# Patient Record
Sex: Female | Born: 1955 | Race: White | Hispanic: No | Marital: Married | Smoking: Former smoker
Health system: Southern US, Community
[De-identification: ages and names within clinical notes are randomized; demographics above are authoritative.]

## PROBLEM LIST (undated history)

## (undated) DIAGNOSIS — G709 Myoneural disorder, unspecified: Secondary | ICD-10-CM

## (undated) DIAGNOSIS — E785 Hyperlipidemia, unspecified: Secondary | ICD-10-CM

## (undated) DIAGNOSIS — F319 Bipolar disorder, unspecified: Secondary | ICD-10-CM

## (undated) DIAGNOSIS — F32A Depression, unspecified: Secondary | ICD-10-CM

## (undated) DIAGNOSIS — F419 Anxiety disorder, unspecified: Secondary | ICD-10-CM

## (undated) DIAGNOSIS — J449 Chronic obstructive pulmonary disease, unspecified: Secondary | ICD-10-CM

## (undated) DIAGNOSIS — J189 Pneumonia, unspecified organism: Secondary | ICD-10-CM

## (undated) DIAGNOSIS — F329 Major depressive disorder, single episode, unspecified: Secondary | ICD-10-CM

## (undated) HISTORY — PX: BREAST BIOPSY: SHX20

## (undated) HISTORY — PX: ABDOMINAL HYSTERECTOMY: SHX81

## (undated) HISTORY — PX: KNEE ARTHROSCOPY: SUR90

## (undated) HISTORY — PX: BACK SURGERY: SHX140

## (undated) HISTORY — PX: CARPAL TUNNEL RELEASE: SHX101

## (undated) HISTORY — PX: TUBAL LIGATION: SHX77

---

## 1999-03-22 ENCOUNTER — Ambulatory Visit (HOSPITAL_COMMUNITY): Admission: RE | Admit: 1999-03-22 | Discharge: 1999-03-22 | Payer: Self-pay | Admitting: Neurosurgery

## 1999-03-22 ENCOUNTER — Encounter: Payer: Self-pay | Admitting: Neurosurgery

## 1999-04-06 ENCOUNTER — Encounter: Payer: Self-pay | Admitting: Neurosurgery

## 1999-04-06 ENCOUNTER — Ambulatory Visit (HOSPITAL_COMMUNITY): Admission: RE | Admit: 1999-04-06 | Discharge: 1999-04-06 | Payer: Self-pay | Admitting: Neurosurgery

## 1999-04-20 ENCOUNTER — Ambulatory Visit (HOSPITAL_COMMUNITY): Admission: RE | Admit: 1999-04-20 | Discharge: 1999-04-20 | Payer: Self-pay | Admitting: Neurosurgery

## 1999-04-20 ENCOUNTER — Encounter: Payer: Self-pay | Admitting: Neurosurgery

## 1999-06-21 ENCOUNTER — Encounter: Payer: Self-pay | Admitting: Neurosurgery

## 1999-06-26 ENCOUNTER — Inpatient Hospital Stay (HOSPITAL_COMMUNITY): Admission: RE | Admit: 1999-06-26 | Discharge: 1999-06-26 | Payer: Self-pay | Admitting: Neurosurgery

## 1999-06-26 ENCOUNTER — Encounter: Payer: Self-pay | Admitting: Neurosurgery

## 1999-07-25 ENCOUNTER — Ambulatory Visit (HOSPITAL_COMMUNITY): Admission: RE | Admit: 1999-07-25 | Discharge: 1999-07-25 | Payer: Self-pay | Admitting: Neurosurgery

## 1999-07-25 ENCOUNTER — Encounter: Payer: Self-pay | Admitting: Neurosurgery

## 1999-10-02 ENCOUNTER — Encounter: Payer: Self-pay | Admitting: Neurosurgery

## 1999-10-02 ENCOUNTER — Ambulatory Visit (HOSPITAL_COMMUNITY): Admission: RE | Admit: 1999-10-02 | Discharge: 1999-10-02 | Payer: Self-pay | Admitting: Neurosurgery

## 2001-04-20 ENCOUNTER — Ambulatory Visit (HOSPITAL_COMMUNITY): Admission: RE | Admit: 2001-04-20 | Discharge: 2001-04-20 | Payer: Self-pay | Admitting: Neurosurgery

## 2001-04-20 ENCOUNTER — Encounter: Payer: Self-pay | Admitting: Neurosurgery

## 2001-04-23 ENCOUNTER — Ambulatory Visit (HOSPITAL_COMMUNITY): Admission: RE | Admit: 2001-04-23 | Discharge: 2001-04-23 | Payer: Self-pay | Admitting: Neurosurgery

## 2001-04-23 ENCOUNTER — Encounter: Payer: Self-pay | Admitting: Neurosurgery

## 2003-08-02 ENCOUNTER — Ambulatory Visit (HOSPITAL_COMMUNITY): Admission: RE | Admit: 2003-08-02 | Discharge: 2003-08-03 | Payer: Self-pay | Admitting: Neurosurgery

## 2003-08-31 ENCOUNTER — Encounter: Admission: RE | Admit: 2003-08-31 | Discharge: 2003-08-31 | Payer: Self-pay | Admitting: Neurosurgery

## 2003-11-03 ENCOUNTER — Encounter: Admission: RE | Admit: 2003-11-03 | Discharge: 2003-11-03 | Payer: Self-pay | Admitting: Neurosurgery

## 2004-04-04 ENCOUNTER — Encounter: Admission: RE | Admit: 2004-04-04 | Discharge: 2004-04-04 | Payer: Self-pay | Admitting: Neurosurgery

## 2006-02-15 IMAGING — CR DG LUMBAR SPINE 2-3V
3 series · 3 of 3 positions shown · non-contrast
Comparison: none

CLINICAL DATA: Bilateral hip pain, status post lumbar fusion on 07/23/03.
 TWO VIEW LUMBAR SPINE ? 11/03/03 
 Comparison 08/31/03.  AP and two lateral views again demonstrate a transitional lumbosacral vertebra labeled L5 for counting purposes.  This has bilateral pseudoarticulations with the sacrum with secondary degenerative changes on the left.  Stable ray cage and pedicle screw and rod fixation at the L4-5 level with normal alignment.  Stable moderate disk space narrowing and minimal anterior and posterior spur formation at the L5-S1 level.  
 IMPRESSION
 1.  Stable hardware fusion at the L4-5 level with normal alignment.
 2.  Transitional L5 vertebra with bilateral pseudoarticulations with the sacrum with secondary degenerative changes on the left.

[view not recorded (1 of 3)]
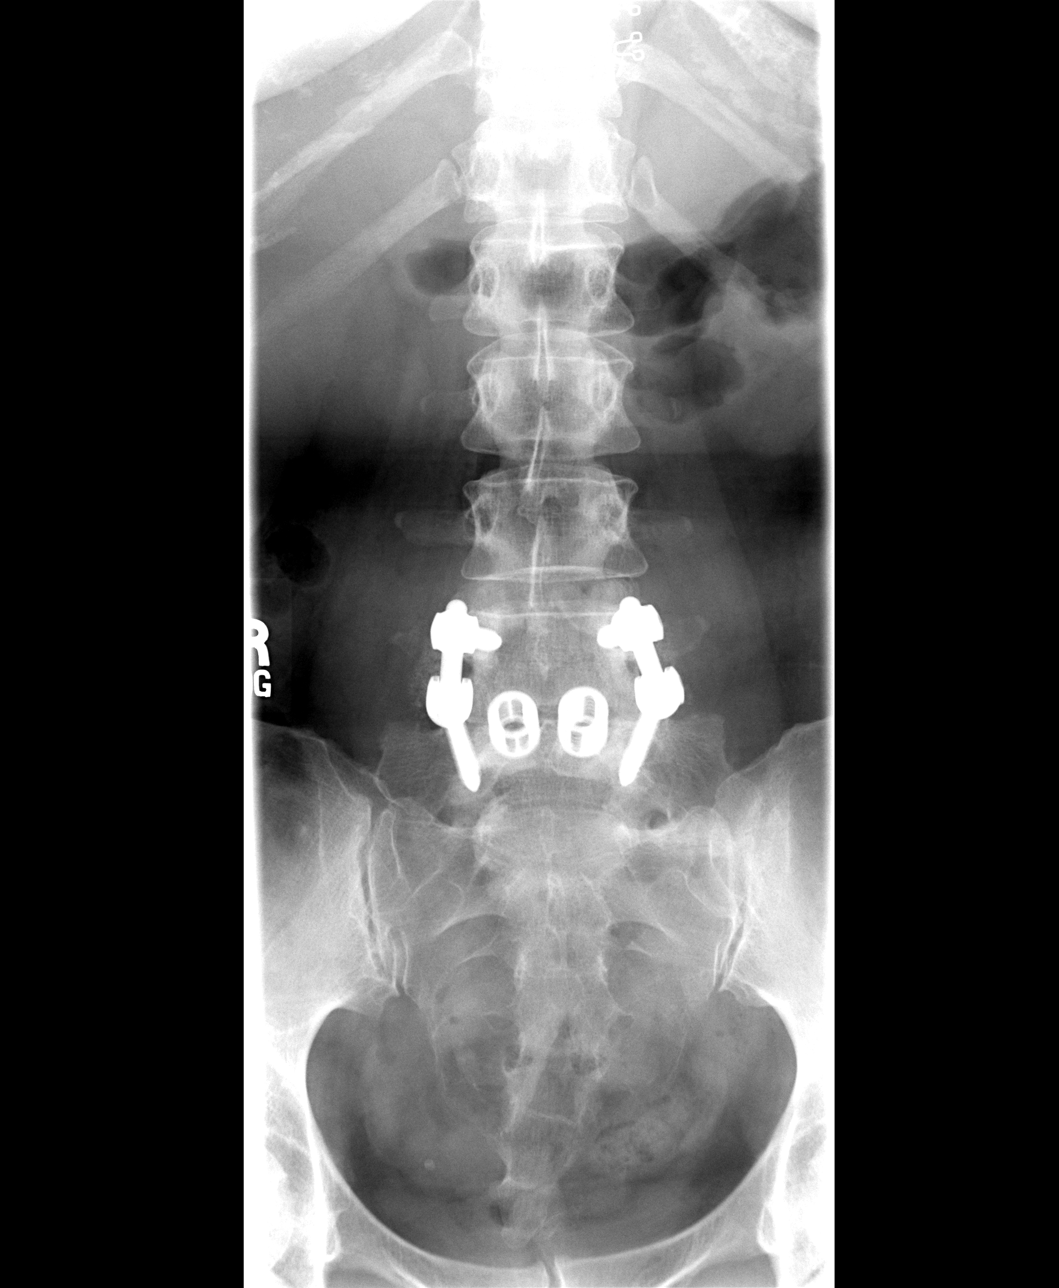

[view not recorded (2 of 3)]
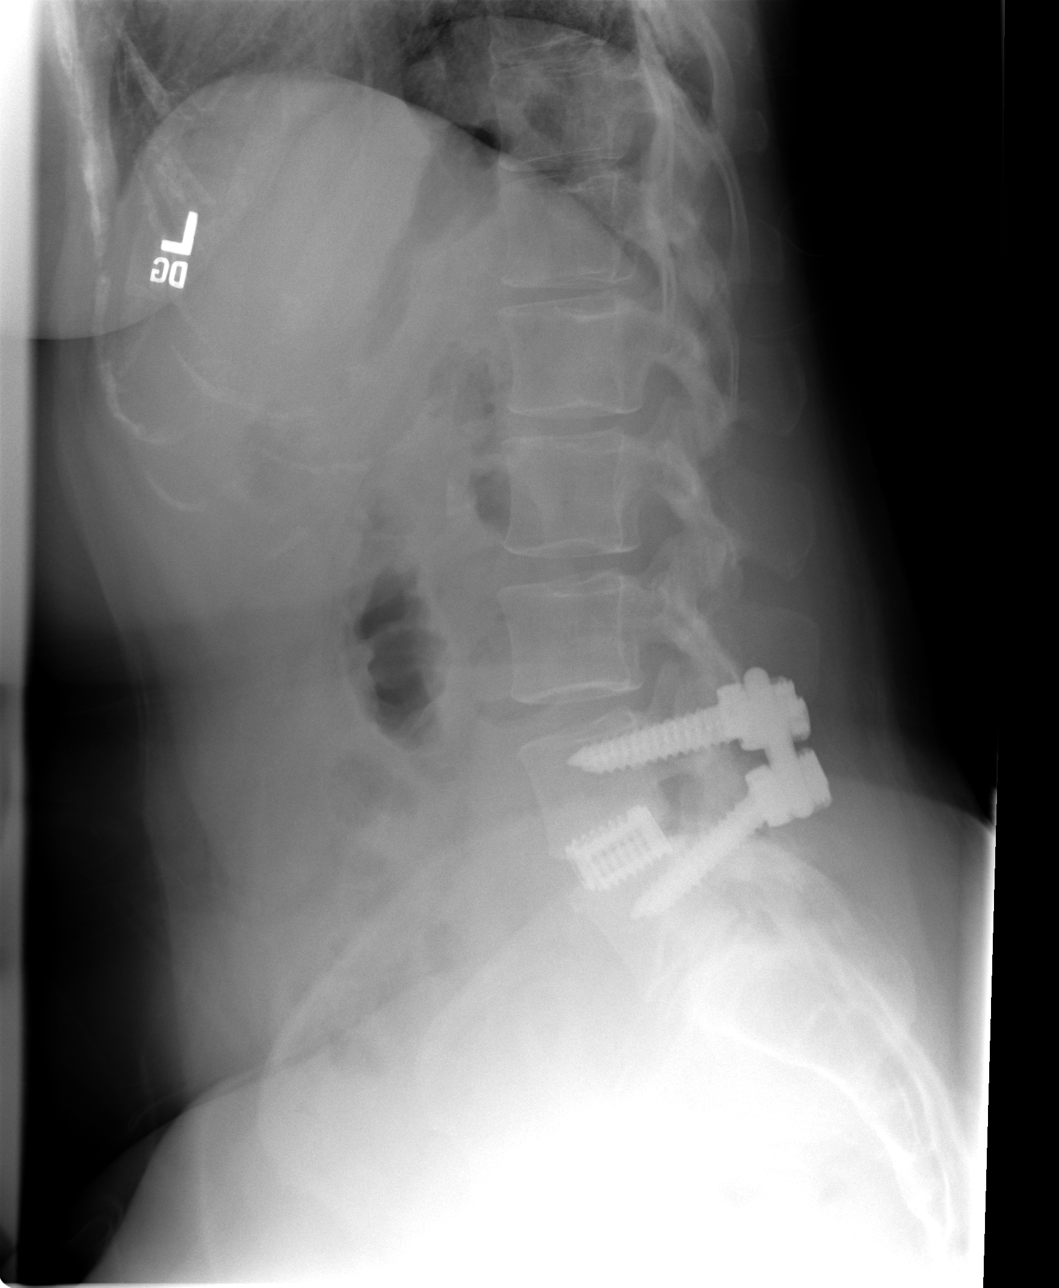

[view not recorded (3 of 3)]
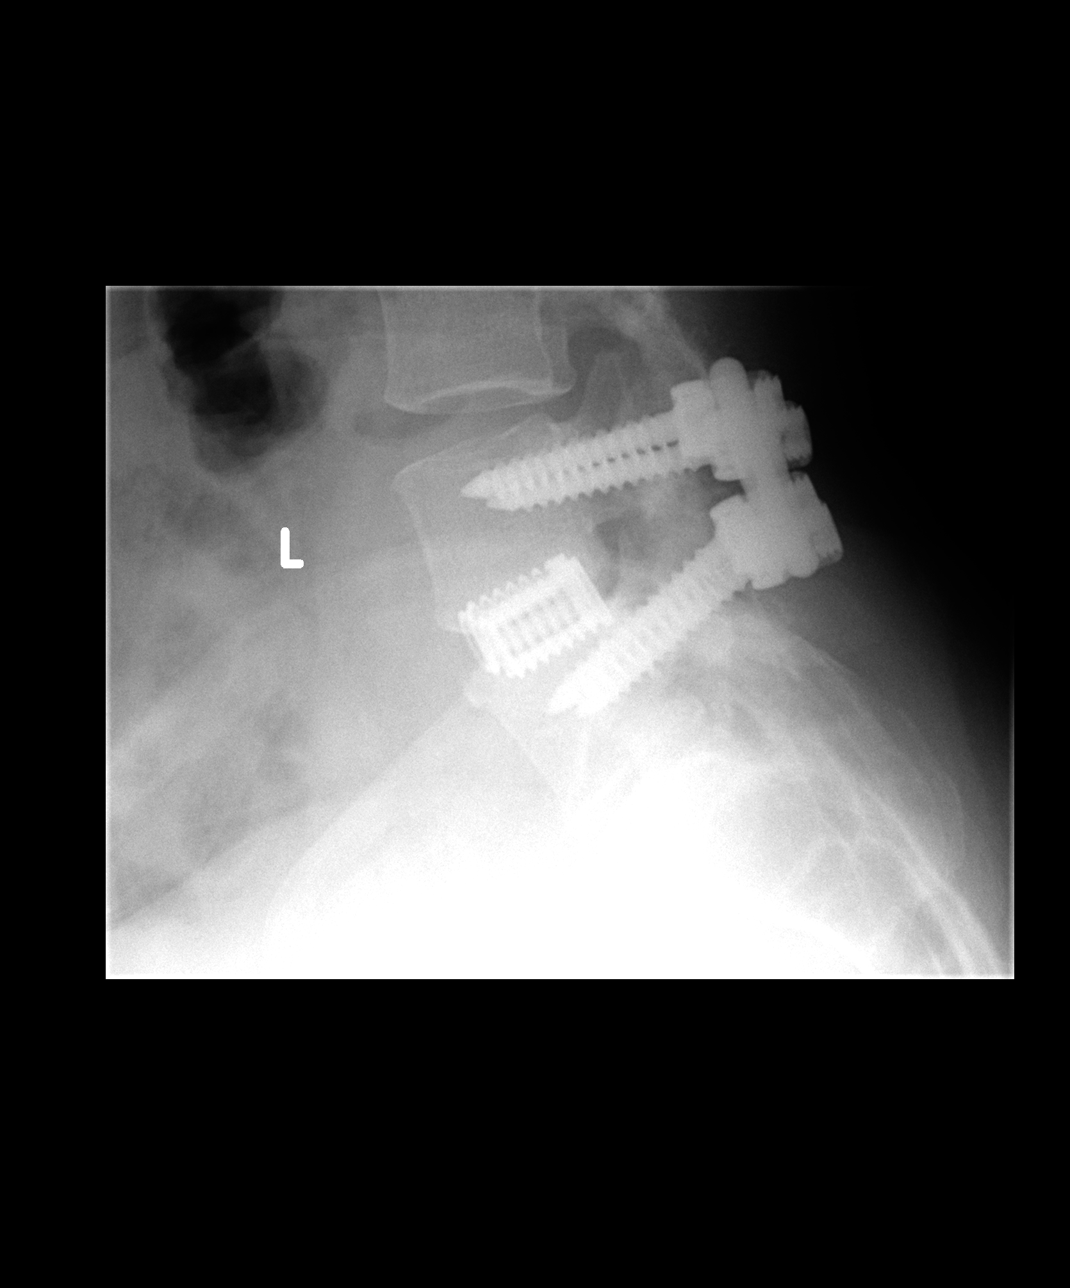

[3 of 3 positions shown; findings below may reference images not displayed]

## 2012-07-07 ENCOUNTER — Other Ambulatory Visit: Payer: Self-pay | Admitting: Neurosurgery

## 2012-07-07 ENCOUNTER — Encounter (HOSPITAL_COMMUNITY): Payer: Self-pay | Admitting: Pharmacy Technician

## 2012-07-10 ENCOUNTER — Encounter (HOSPITAL_COMMUNITY)
Admission: RE | Admit: 2012-07-10 | Discharge: 2012-07-10 | Disposition: A | Discharge status: Home / Self Care | Payer: Worker's Compensation | Source: Ambulatory Visit | Attending: Neurosurgery | Admitting: Neurosurgery

## 2012-07-10 ENCOUNTER — Encounter (HOSPITAL_COMMUNITY): Payer: Self-pay

## 2012-07-10 HISTORY — DX: Chronic obstructive pulmonary disease, unspecified: J44.9

## 2012-07-10 HISTORY — DX: Depression, unspecified: F32.A

## 2012-07-10 HISTORY — DX: Major depressive disorder, single episode, unspecified: F32.9

## 2012-07-10 HISTORY — DX: Pneumonia, unspecified organism: J18.9

## 2012-07-10 HISTORY — DX: Hyperlipidemia, unspecified: E78.5

## 2012-07-10 HISTORY — DX: Myoneural disorder, unspecified: G70.9

## 2012-07-10 HISTORY — DX: Anxiety disorder, unspecified: F41.9

## 2012-07-10 HISTORY — DX: Bipolar disorder, unspecified: F31.9

## 2012-07-10 LAB — CBC WITH DIFFERENTIAL/PLATELET
Basophils Absolute: 0 10*3/uL (ref 0.0–0.1)
Basophils Relative: 0 % (ref 0–1)
Eosinophils Absolute: 0.2 10*3/uL (ref 0.0–0.7)
HCT: 39 % (ref 36.0–46.0)
MCH: 31 pg (ref 26.0–34.0)
MCHC: 33.3 g/dL (ref 30.0–36.0)
Monocytes Absolute: 0.6 10*3/uL (ref 0.1–1.0)
Neutro Abs: 4.5 10*3/uL (ref 1.7–7.7)
Neutrophils Relative %: 53 % (ref 43–77)
RDW: 12.8 % (ref 11.5–15.5)

## 2012-07-10 LAB — BASIC METABOLIC PANEL
BUN: 8 mg/dL (ref 6–23)
CO2: 28 mEq/L (ref 19–32)
Calcium: 9.8 mg/dL (ref 8.4–10.5)
Chloride: 102 mEq/L (ref 96–112)
Creatinine, Ser: 0.87 mg/dL (ref 0.50–1.10)
GFR calc Af Amer: 85 mL/min — ABNORMAL LOW (ref >90)
GFR calc non Af Amer: 74 mL/min — ABNORMAL LOW (ref >90)
Glucose, Bld: 78 mg/dL (ref 70–99)
Potassium: 3.6 mEq/L (ref 3.5–5.1)
Sodium: 140 mEq/L (ref 135–145)

## 2012-07-10 LAB — SURGICAL PCR SCREEN
MRSA, PCR: NEGATIVE
Staphylococcus aureus: NEGATIVE

## 2012-07-10 NOTE — Pre-Procedure Instructions (Signed)
20 Patricia Miles  07/10/2012   Your procedure is scheduled on:  July 14, 2012 (Tuesday)  Report to [facility] at 8:00 AM.  Call this number if you have problems the morning of surgery: [phone]   Remember:   Do not eat food:After Midnight.    Take these medicines the morning of surgery with A SIP OF WATER: atenolol (tenormin), citalopram (celexa), clorazepate (tranxene),  Venlafaxine (effexor), lamotrigine (lamictal), buspirone (buspar)   Do not wear jewelry, make-up or nail polish.  Do not wear lotions, powders, or perfumes. You may wear deodorant.  Do not shave 48 hours prior to surgery. Men may shave face and neck.  Do not bring valuables to the hospital.  Contacts, dentures or bridgework may not be worn into surgery.  Leave suitcase in the car. After surgery it may be brought to your room.  For patients admitted to the hospital, checkout time is 11:00 AM the day of discharge.   Patients discharged the day of surgery will not be allowed to drive home.  Name and phone number of your driver:   Special Instructions: Shower using CHG 2 nights before surgery and the night before surgery.  If you shower the day of surgery use CHG.  Use special wash - you have one bottle of CHG for all showers.  You should use approximately 1/3 of the bottle for each shower.   Please read over the following fact sheets that you were given: Pain Booklet, Coughing and Deep Breathing, Blood Transfusion Information and Surgical Site Infection Prevention

## 2012-07-11 LAB — TYPE AND SCREEN

## 2012-07-13 MED ORDER — DEXAMETHASONE SODIUM PHOSPHATE 10 MG/ML IJ SOLN
10.0000 mg | INTRAMUSCULAR | Status: AC
  Administered 2012-07-14: 10 mg via INTRAVENOUS
  Filled 2012-07-13: qty 1

## 2012-07-13 MED ORDER — CEFAZOLIN SODIUM-DEXTROSE 2-3 GM-% IV SOLR
2.0000 g | INTRAVENOUS | Status: AC
  Administered 2012-07-14: 2 g via INTRAVENOUS
  Filled 2012-07-13: qty 50

## 2012-07-14 ENCOUNTER — Ambulatory Visit (HOSPITAL_COMMUNITY): Payer: Worker's Compensation

## 2012-07-14 ENCOUNTER — Encounter (HOSPITAL_COMMUNITY): Payer: Self-pay | Admitting: Anesthesiology

## 2012-07-14 ENCOUNTER — Encounter (HOSPITAL_COMMUNITY): Payer: Self-pay | Admitting: *Deleted

## 2012-07-14 ENCOUNTER — Inpatient Hospital Stay (HOSPITAL_COMMUNITY)
Admission: RE | Admit: 2012-07-14 | Discharge: 2012-07-15 | DRG: 460 | Disposition: A | Discharge status: Home / Self Care | Payer: Worker's Compensation | Source: Ambulatory Visit | Attending: Neurosurgery | Admitting: Neurosurgery

## 2012-07-14 ENCOUNTER — Encounter (HOSPITAL_COMMUNITY): Payer: Self-pay | Admitting: Neurosurgery

## 2012-07-14 ENCOUNTER — Ambulatory Visit (HOSPITAL_COMMUNITY): Payer: Worker's Compensation | Admitting: Anesthesiology

## 2012-07-14 ENCOUNTER — Encounter (HOSPITAL_COMMUNITY)
Admission: RE | Disposition: A | Discharge status: Home / Self Care | Payer: Self-pay | Source: Ambulatory Visit | Attending: Neurosurgery

## 2012-07-14 DIAGNOSIS — Z87891 Personal history of nicotine dependence: Secondary | ICD-10-CM | POA: Diagnosis not present

## 2012-07-14 DIAGNOSIS — E785 Hyperlipidemia, unspecified: Secondary | ICD-10-CM | POA: Diagnosis present

## 2012-07-14 DIAGNOSIS — F411 Generalized anxiety disorder: Secondary | ICD-10-CM | POA: Diagnosis present

## 2012-07-14 DIAGNOSIS — J449 Chronic obstructive pulmonary disease, unspecified: Secondary | ICD-10-CM | POA: Diagnosis present

## 2012-07-14 DIAGNOSIS — F319 Bipolar disorder, unspecified: Secondary | ICD-10-CM | POA: Diagnosis present

## 2012-07-14 DIAGNOSIS — M48062 Spinal stenosis, lumbar region with neurogenic claudication: Secondary | ICD-10-CM | POA: Diagnosis present

## 2012-07-14 DIAGNOSIS — Z79899 Other long term (current) drug therapy: Secondary | ICD-10-CM

## 2012-07-14 DIAGNOSIS — Z981 Arthrodesis status: Secondary | ICD-10-CM

## 2012-07-14 DIAGNOSIS — J4489 Other specified chronic obstructive pulmonary disease: Secondary | ICD-10-CM | POA: Diagnosis present

## 2012-07-14 SURGERY — POSTERIOR LUMBAR FUSION 1 WITH HARDWARE REMOVAL
Anesthesia: General | Site: Back | Wound class: Clean

## 2012-07-14 MED ORDER — LAMOTRIGINE 200 MG PO TABS
200.0000 mg | ORAL_TABLET | Freq: Every day | ORAL | Status: DC
  Administered 2012-07-15: 200 mg via ORAL
  Filled 2012-07-14 (×2): qty 1

## 2012-07-14 MED ORDER — LIDOCAINE HCL (CARDIAC) 20 MG/ML IV SOLN
INTRAVENOUS | Status: DC | PRN
  Administered 2012-07-14: 100 mg via INTRAVENOUS

## 2012-07-14 MED ORDER — ONDANSETRON HCL 4 MG/2ML IJ SOLN
INTRAMUSCULAR | Status: DC | PRN
  Administered 2012-07-14: 4 mg via INTRAVENOUS

## 2012-07-14 MED ORDER — LACTATED RINGERS IV SOLN
INTRAVENOUS | Status: DC | PRN
  Administered 2012-07-14 (×2): via INTRAVENOUS

## 2012-07-14 MED ORDER — SIMVASTATIN 40 MG PO TABS
40.0000 mg | ORAL_TABLET | Freq: Every evening | ORAL | Status: DC
  Administered 2012-07-14: 40 mg via ORAL
  Filled 2012-07-14 (×2): qty 1

## 2012-07-14 MED ORDER — FENTANYL CITRATE 0.05 MG/ML IJ SOLN
INTRAMUSCULAR | Status: DC | PRN
  Administered 2012-07-14 (×2): 50 ug via INTRAVENOUS
  Administered 2012-07-14 (×2): 100 ug via INTRAVENOUS

## 2012-07-14 MED ORDER — FLEET ENEMA 7-19 GM/118ML RE ENEM
1.0000 | ENEMA | Freq: Once | RECTAL | Status: AC | PRN
  Filled 2012-07-14: qty 1

## 2012-07-14 MED ORDER — MIDAZOLAM HCL 5 MG/5ML IJ SOLN
INTRAMUSCULAR | Status: DC | PRN
  Administered 2012-07-14: 2 mg via INTRAVENOUS

## 2012-07-14 MED ORDER — BUSPIRONE HCL 15 MG PO TABS
15.0000 mg | ORAL_TABLET | Freq: Three times a day (TID) | ORAL | Status: DC
  Administered 2012-07-14 – 2012-07-15 (×3): 15 mg via ORAL
  Filled 2012-07-14 (×6): qty 1

## 2012-07-14 MED ORDER — OXYCODONE HCL 5 MG PO TABS: 5.0000 mg | ORAL_TABLET | Freq: Once | ORAL | Status: DC | PRN

## 2012-07-14 MED ORDER — MENTHOL 3 MG MT LOZG: 1.0000 | LOZENGE | OROMUCOSAL | Status: DC | PRN

## 2012-07-14 MED ORDER — EPHEDRINE SULFATE 50 MG/ML IJ SOLN
INTRAMUSCULAR | Status: DC | PRN
  Administered 2012-07-14: 10 mg via INTRAVENOUS
  Administered 2012-07-14 (×2): 5 mg via INTRAVENOUS

## 2012-07-14 MED ORDER — SODIUM CHLORIDE 0.9 % IJ SOLN
3.0000 mL | Freq: Two times a day (BID) | INTRAMUSCULAR | Status: DC
  Administered 2012-07-14: 3 mL via INTRAVENOUS

## 2012-07-14 MED ORDER — ARTIFICIAL TEARS OP OINT
TOPICAL_OINTMENT | OPHTHALMIC | Status: DC | PRN
  Administered 2012-07-14: 1 via OPHTHALMIC

## 2012-07-14 MED ORDER — ROCURONIUM BROMIDE 100 MG/10ML IV SOLN
INTRAVENOUS | Status: DC | PRN
  Administered 2012-07-14: 10 mg via INTRAVENOUS
  Administered 2012-07-14: 50 mg via INTRAVENOUS
  Administered 2012-07-14: 10 mg via INTRAVENOUS
  Administered 2012-07-14: 20 mg via INTRAVENOUS

## 2012-07-14 MED ORDER — PHENOL 1.4 % MT LIQD: 1.0000 | OROMUCOSAL | Status: DC | PRN

## 2012-07-14 MED ORDER — HYDROMORPHONE HCL PF 1 MG/ML IJ SOLN
0.2500 mg | INTRAMUSCULAR | Status: DC | PRN
  Administered 2012-07-14 (×4): 0.5 mg via INTRAVENOUS

## 2012-07-14 MED ORDER — BACITRACIN 50000 UNITS IM SOLR
INTRAMUSCULAR | Status: AC
  Filled 2012-07-14: qty 1

## 2012-07-14 MED ORDER — NEOSTIGMINE METHYLSULFATE 1 MG/ML IJ SOLN
INTRAMUSCULAR | Status: DC | PRN
  Administered 2012-07-14: 4 mg via INTRAVENOUS

## 2012-07-14 MED ORDER — SENNA 8.6 MG PO TABS
1.0000 | ORAL_TABLET | Freq: Two times a day (BID) | ORAL | Status: DC
  Administered 2012-07-14 – 2012-07-15 (×2): 8.6 mg via ORAL
  Filled 2012-07-14 (×3): qty 1

## 2012-07-14 MED ORDER — ADULT MULTIVITAMIN W/MINERALS CH
1.0000 | ORAL_TABLET | Freq: Every day | ORAL | Status: DC
  Administered 2012-07-14 – 2012-07-15 (×2): 1 via ORAL
  Filled 2012-07-14 (×2): qty 1

## 2012-07-14 MED ORDER — CLORAZEPATE DIPOTASSIUM 7.5 MG PO TABS
7.5000 mg | ORAL_TABLET | Freq: Two times a day (BID) | ORAL | Status: DC
  Administered 2012-07-14 – 2012-07-15 (×2): 7.5 mg via ORAL
  Filled 2012-07-14 (×2): qty 2

## 2012-07-14 MED ORDER — 0.9 % SODIUM CHLORIDE (POUR BTL) OPTIME
TOPICAL | Status: DC | PRN
  Administered 2012-07-14: 1000 mL

## 2012-07-14 MED ORDER — GABAPENTIN 100 MG PO CAPS
200.0000 mg | ORAL_CAPSULE | Freq: Two times a day (BID) | ORAL | Status: DC
  Administered 2012-07-14 – 2012-07-15 (×2): 200 mg via ORAL
  Filled 2012-07-14 (×3): qty 2

## 2012-07-14 MED ORDER — ZOLPIDEM TARTRATE 5 MG PO TABS: 5.0000 mg | ORAL_TABLET | Freq: Every evening | ORAL | Status: DC | PRN

## 2012-07-14 MED ORDER — PHENYLEPHRINE HCL 10 MG/ML IJ SOLN
INTRAMUSCULAR | Status: DC | PRN
  Administered 2012-07-14 (×3): 40 ug via INTRAVENOUS
  Administered 2012-07-14 (×5): 80 ug via INTRAVENOUS
  Administered 2012-07-14: 40 ug via INTRAVENOUS

## 2012-07-14 MED ORDER — MEPERIDINE HCL 25 MG/ML IJ SOLN: 6.2500 mg | INTRAMUSCULAR | Status: DC | PRN

## 2012-07-14 MED ORDER — BUPIVACAINE HCL (PF) 0.25 % IJ SOLN
INTRAMUSCULAR | Status: DC | PRN
  Administered 2012-07-14: 20 mL

## 2012-07-14 MED ORDER — ACETAMINOPHEN 650 MG RE SUPP: 650.0000 mg | RECTAL | Status: DC | PRN

## 2012-07-14 MED ORDER — SODIUM CHLORIDE 0.9 % IJ SOLN: 3.0000 mL | INTRAMUSCULAR | Status: DC | PRN

## 2012-07-14 MED ORDER — HYDROMORPHONE HCL PF 1 MG/ML IJ SOLN
INTRAMUSCULAR | Status: AC
  Filled 2012-07-14: qty 1

## 2012-07-14 MED ORDER — QUETIAPINE FUMARATE 300 MG PO TABS
300.0000 mg | ORAL_TABLET | Freq: Every day | ORAL | Status: DC
  Administered 2012-07-14: 300 mg via ORAL
  Filled 2012-07-14 (×2): qty 1

## 2012-07-14 MED ORDER — HYDROCODONE-ACETAMINOPHEN 5-325 MG PO TABS: 1.0000 | ORAL_TABLET | ORAL | Status: DC | PRN

## 2012-07-14 MED ORDER — GABAPENTIN 300 MG PO CAPS
300.0000 mg | ORAL_CAPSULE | Freq: Every day | ORAL | Status: DC
  Administered 2012-07-14: 300 mg via ORAL
  Filled 2012-07-14 (×2): qty 1

## 2012-07-14 MED ORDER — GLYCOPYRROLATE 0.2 MG/ML IJ SOLN
INTRAMUSCULAR | Status: DC | PRN
  Administered 2012-07-14: 0.2 mg via INTRAVENOUS
  Administered 2012-07-14: .8 mg via INTRAVENOUS

## 2012-07-14 MED ORDER — HYDROMORPHONE HCL PF 1 MG/ML IJ SOLN: 0.5000 mg | INTRAMUSCULAR | Status: DC | PRN

## 2012-07-14 MED ORDER — SODIUM CHLORIDE 0.9 % IV SOLN
INTRAVENOUS | Status: AC
  Filled 2012-07-14: qty 500

## 2012-07-14 MED ORDER — OXYCODONE-ACETAMINOPHEN 5-325 MG PO TABS
1.0000 | ORAL_TABLET | ORAL | Status: DC | PRN
  Administered 2012-07-14 – 2012-07-15 (×3): 2 via ORAL
  Filled 2012-07-14 (×4): qty 2

## 2012-07-14 MED ORDER — CITALOPRAM HYDROBROMIDE 20 MG PO TABS
20.0000 mg | ORAL_TABLET | Freq: Every day | ORAL | Status: DC
  Administered 2012-07-15: 20 mg via ORAL
  Filled 2012-07-14 (×2): qty 1

## 2012-07-14 MED ORDER — PROPOFOL 10 MG/ML IV BOLUS
INTRAVENOUS | Status: DC | PRN
  Administered 2012-07-14: 110 mg via INTRAVENOUS

## 2012-07-14 MED ORDER — ACETAMINOPHEN 325 MG PO TABS: 650.0000 mg | ORAL_TABLET | ORAL | Status: DC | PRN

## 2012-07-14 MED ORDER — SODIUM CHLORIDE 0.9 % IR SOLN
Status: DC | PRN
  Administered 2012-07-14: 12:00:00

## 2012-07-14 MED ORDER — VENLAFAXINE HCL 75 MG PO TABS
75.0000 mg | ORAL_TABLET | Freq: Three times a day (TID) | ORAL | Status: DC
  Administered 2012-07-14 – 2012-07-15 (×3): 75 mg via ORAL
  Filled 2012-07-14 (×5): qty 1

## 2012-07-14 MED ORDER — ONDANSETRON HCL 4 MG/2ML IJ SOLN: 4.0000 mg | INTRAMUSCULAR | Status: DC | PRN

## 2012-07-14 MED ORDER — THROMBIN 20000 UNITS EX SOLR
CUTANEOUS | Status: DC | PRN
  Administered 2012-07-14: 12:00:00 via TOPICAL

## 2012-07-14 MED ORDER — CEFAZOLIN SODIUM 1-5 GM-% IV SOLN
1.0000 g | Freq: Three times a day (TID) | INTRAVENOUS | Status: AC
  Administered 2012-07-14 – 2012-07-15 (×2): 1 g via INTRAVENOUS
  Filled 2012-07-14 (×2): qty 50

## 2012-07-14 MED ORDER — DIAZEPAM 5 MG PO TABS
5.0000 mg | ORAL_TABLET | Freq: Four times a day (QID) | ORAL | Status: DC | PRN
  Filled 2012-07-14: qty 1

## 2012-07-14 MED ORDER — POLYETHYLENE GLYCOL 3350 17 G PO PACK
17.0000 g | PACK | Freq: Every day | ORAL | Status: DC | PRN
  Filled 2012-07-14: qty 1

## 2012-07-14 MED ORDER — ALUM & MAG HYDROXIDE-SIMETH 200-200-20 MG/5ML PO SUSP: 30.0000 mL | Freq: Four times a day (QID) | ORAL | Status: DC | PRN

## 2012-07-14 MED ORDER — OXYCODONE HCL 5 MG/5ML PO SOLN: 5.0000 mg | Freq: Once | ORAL | Status: DC | PRN

## 2012-07-14 MED ORDER — PREGABALIN 50 MG PO CAPS
75.0000 mg | ORAL_CAPSULE | Freq: Two times a day (BID) | ORAL | Status: DC
  Administered 2012-07-14 – 2012-07-15 (×2): 75 mg via ORAL
  Filled 2012-07-14 (×2): qty 1

## 2012-07-14 MED ORDER — ONDANSETRON HCL 4 MG/2ML IJ SOLN: 4.0000 mg | Freq: Once | INTRAMUSCULAR | Status: DC | PRN

## 2012-07-14 MED ORDER — SODIUM CHLORIDE 0.9 % IV SOLN: 250.0000 mL | INTRAVENOUS | Status: DC

## 2012-07-14 MED ORDER — ATENOLOL 25 MG PO TABS
25.0000 mg | ORAL_TABLET | Freq: Every day | ORAL | Status: DC
  Administered 2012-07-15: 25 mg via ORAL
  Filled 2012-07-14 (×2): qty 1

## 2012-07-14 MED ORDER — BISACODYL 10 MG RE SUPP: 10.0000 mg | Freq: Every day | RECTAL | Status: DC | PRN

## 2012-07-14 SURGICAL SUPPLY — 71 items
ADH SKN CLS APL DERMABOND .7 (GAUZE/BANDAGES/DRESSINGS)
ADH SKN CLS LQ APL DERMABOND (GAUZE/BANDAGES/DRESSINGS) ×1
APL SKNCLS STERI-STRIP NONHPOA (GAUZE/BANDAGES/DRESSINGS) ×1
BAG DECANTER FOR FLEXI CONT (MISCELLANEOUS) ×2 IMPLANT
BENZOIN TINCTURE PRP APPL 2/3 (GAUZE/BANDAGES/DRESSINGS) ×2 IMPLANT
BLADE SURG ROTATE 9660 (MISCELLANEOUS) IMPLANT
BRUSH SCRUB EZ PLAIN DRY (MISCELLANEOUS) ×2 IMPLANT
BUR MATCHSTICK NEURO 3.0 LAGG (BURR) ×2 IMPLANT
CAGE CAPSTONE BULLET 8X22 (Cage) ×1 IMPLANT
CANISTER SUCTION 2500CC (MISCELLANEOUS) ×2 IMPLANT
CLOTH BEACON ORANGE TIMEOUT ST (SAFETY) ×2 IMPLANT
CONT SPEC 4OZ CLIKSEAL STRL BL (MISCELLANEOUS) ×4 IMPLANT
COVER BACK TABLE 24X17X13 BIG (DRAPES) IMPLANT
COVER TABLE BACK 60X90 (DRAPES) ×2 IMPLANT
DECANTER SPIKE VIAL GLASS SM (MISCELLANEOUS) ×1 IMPLANT
DERMABOND ADHESIVE PROPEN (GAUZE/BANDAGES/DRESSINGS) ×1
DERMABOND ADVANCED (GAUZE/BANDAGES/DRESSINGS)
DERMABOND ADVANCED .7 DNX12 (GAUZE/BANDAGES/DRESSINGS) ×1 IMPLANT
DERMABOND ADVANCED .7 DNX6 (GAUZE/BANDAGES/DRESSINGS) IMPLANT
DRAPE C-ARM 42X72 X-RAY (DRAPES) ×4 IMPLANT
DRAPE LAPAROTOMY 100X72X124 (DRAPES) ×2 IMPLANT
DRAPE POUCH INSTRU U-SHP 10X18 (DRAPES) ×2 IMPLANT
DRAPE PROXIMA HALF (DRAPES) IMPLANT
DRAPE SURG 17X23 STRL (DRAPES) ×8 IMPLANT
ELECT REM PT RETURN 9FT ADLT (ELECTROSURGICAL) ×2
ELECTRODE REM PT RTRN 9FT ADLT (ELECTROSURGICAL) ×1 IMPLANT
EVACUATOR 1/8 PVC DRAIN (DRAIN) ×2 IMPLANT
GAUZE SPONGE 4X4 16PLY XRAY LF (GAUZE/BANDAGES/DRESSINGS) IMPLANT
GLOVE BIOGEL PI IND STRL 7.0 (GLOVE) IMPLANT
GLOVE BIOGEL PI IND STRL 7.5 (GLOVE) IMPLANT
GLOVE BIOGEL PI IND STRL 8.5 (GLOVE) IMPLANT
GLOVE BIOGEL PI INDICATOR 7.0 (GLOVE) ×1
GLOVE BIOGEL PI INDICATOR 7.5 (GLOVE) ×1
GLOVE BIOGEL PI INDICATOR 8.5 (GLOVE) ×1
GLOVE ECLIPSE 7.5 STRL STRAW (GLOVE) ×2 IMPLANT
GLOVE ECLIPSE 8.0 STRL XLNG CF (GLOVE) ×1 IMPLANT
GLOVE ECLIPSE 8.5 STRL (GLOVE) ×4 IMPLANT
GLOVE EXAM NITRILE LRG STRL (GLOVE) IMPLANT
GLOVE EXAM NITRILE MD LF STRL (GLOVE) ×1 IMPLANT
GLOVE EXAM NITRILE XL STR (GLOVE) IMPLANT
GLOVE EXAM NITRILE XS STR PU (GLOVE) IMPLANT
GLOVE SS BIOGEL STRL SZ 6.5 (GLOVE) IMPLANT
GLOVE SUPERSENSE BIOGEL SZ 6.5 (GLOVE) ×5
GOWN BRE IMP SLV AUR LG STRL (GOWN DISPOSABLE) ×2 IMPLANT
GOWN BRE IMP SLV AUR XL STRL (GOWN DISPOSABLE) ×6 IMPLANT
GOWN STRL REIN 2XL LVL4 (GOWN DISPOSABLE) IMPLANT
KIT BASIN OR (CUSTOM PROCEDURE TRAY) ×2 IMPLANT
KIT ROOM TURNOVER OR (KITS) ×2 IMPLANT
MILL MEDIUM DISP (BLADE) ×1 IMPLANT
NEEDLE HYPO 22GX1.5 SAFETY (NEEDLE) ×2 IMPLANT
NS IRRIG 1000ML POUR BTL (IV SOLUTION) ×2 IMPLANT
PACK LAMINECTOMY NEURO (CUSTOM PROCEDURE TRAY) ×2 IMPLANT
ROD PREBENT 6.35X60 (Rod) ×2 IMPLANT
SCREW DANEK NONBREAK (Screw) ×5 IMPLANT
SCREW PEDICLE VA L635 6.5X40M (Screw) ×3 IMPLANT
SCREW PEDICLE VA L635 7.5X40M (Screw) ×4 IMPLANT
SCREW SET NON BREAK OFF (Screw) ×6 IMPLANT
SPONGE GAUZE 4X4 12PLY (GAUZE/BANDAGES/DRESSINGS) ×2 IMPLANT
SPONGE SURGIFOAM ABS GEL 100 (HEMOSTASIS) ×2 IMPLANT
STRIP CLOSURE SKIN 1/2X4 (GAUZE/BANDAGES/DRESSINGS) ×3 IMPLANT
SUT VIC AB 0 CT1 18XCR BRD8 (SUTURE) ×2 IMPLANT
SUT VIC AB 0 CT1 8-18 (SUTURE) ×2
SUT VIC AB 2-0 CT1 18 (SUTURE) ×2 IMPLANT
SUT VIC AB 3-0 SH 8-18 (SUTURE) ×3 IMPLANT
SYR 20ML ECCENTRIC (SYRINGE) ×2 IMPLANT
TAPE CLOTH SURG 4X10 WHT LF (GAUZE/BANDAGES/DRESSINGS) ×1 IMPLANT
TOWEL OR 17X24 6PK STRL BLUE (TOWEL DISPOSABLE) ×2 IMPLANT
TOWEL OR 17X26 10 PK STRL BLUE (TOWEL DISPOSABLE) ×2 IMPLANT
TRAY FOLEY CATH 14FRSI W/METER (CATHETERS) ×2 IMPLANT
WATER STERILE IRR 1000ML POUR (IV SOLUTION) ×2 IMPLANT
WEDGE TANGENT 8X26MM ×1 IMPLANT

## 2012-07-14 NOTE — Plan of Care (Signed)
Problem: Consults Goal: Diagnosis - Spinal Surgery Outcome: Completed/Met Date Met:  07/14/12 [facility]/Lumbar Spine Fusion     

## 2012-07-14 NOTE — H&P (Signed)
Patricia Miles is an 56 y.o. female.   Chief Complaint: Back and bilateral leg pain HPI: 56 year old female status post previous L4-5 decompression infusion presents with progressive bilateral lower extremity pain worsened by standing straight or ambulation. Workup demonstrates evidence of marked adjacent level disease at the L3-4 level with progressive stenosis and instability. Fusion appears solid at L4-5. Plan is for reexploration of L4-5 fusion with L3 for decompression and fusion with instrumentation.  Past Medical History  Diagnosis Date  . Hyperlipemia   . Anxiety   . Depression   . Neuromuscular disorder   . Bipolar disorder   . COPD (chronic obstructive pulmonary disease)     6 years ago, stopped smoking and ok now  . Pneumonia     no current issues    Past Surgical History  Procedure Date  . Abdominal hysterectomy   . Back surgery     x 2  . Carpal tunnel release   . Tubal ligation   . Knee arthroscopy     bilateral knee  . Breast biopsy     History reviewed. No pertinent family history. Social History:  reports that she quit smoking about 6 years ago. Her smoking use included Cigarettes. She does not have any smokeless tobacco history on file. She reports that she does not drink alcohol or use illicit drugs.  Allergies:  Allergies  Allergen Reactions  . Nalfon (Fenoprofen Calcium) Other (See Comments)    Reaction unknown    Medications Prior to Admission  Medication Sig Dispense Refill  . atenolol (TENORMIN) 25 MG tablet Take 25 mg by mouth daily.      . busPIRone (BUSPAR) 15 MG tablet Take 15 mg by mouth 3 (three) times daily.      Marland Kitchen CALCIUM PO Take 1 tablet by mouth daily.      . citalopram (CELEXA) 20 MG tablet Take 20 mg by mouth daily.      . clorazepate (TRANXENE) 3.75 MG tablet Take 7.5 mg by mouth 2 (two) times daily.      . fish oil-omega-3 fatty acids 1000 MG capsule Take 1 g by mouth daily.      Marland Kitchen gabapentin (NEURONTIN) 100 MG capsule Take  200-300 mg by mouth 3 (three) times daily. Take 200mg  in AM and PM and 300mg  at Bedtime      . lamoTRIgine (LAMICTAL) 200 MG tablet Take 200 mg by mouth daily.      . Multiple Vitamin (MULTIVITAMIN WITH MINERALS) TABS Take 1 tablet by mouth daily.      . pregabalin (LYRICA) 75 MG capsule Take 75 mg by mouth 2 (two) times daily.      . QUEtiapine (SEROQUEL) 300 MG tablet Take 300 mg by mouth at bedtime.      . simvastatin (ZOCOR) 40 MG tablet Take 40 mg by mouth every evening.      . venlafaxine (EFFEXOR) 75 MG tablet Take 75 mg by mouth 3 (three) times daily.      Marland Kitchen VITAMIN E PO Take 1 capsule by mouth daily.        No results found for this or any previous visit (from the past 48 hour(s)). No results found.  Review of Systems  Constitutional: Negative.   HENT: Negative.   Eyes: Negative.   Respiratory: Negative.   Cardiovascular: Negative.   Gastrointestinal: Negative.   Genitourinary: Negative.   Musculoskeletal: Negative.   Skin: Negative.   Neurological: Negative.   Endo/Heme/Allergies: Negative.   Psychiatric/Behavioral: Negative.  Blood pressure 97/64, pulse 57, temperature 98 F (36.7 C), temperature source Oral, resp. rate 18, SpO2 98.00%. Physical Exam  Constitutional: She appears well-developed and well-nourished.  HENT:  Head: Normocephalic and atraumatic.  Right Ear: External ear normal.  Left Ear: External ear normal.  Nose: Nose normal.  Mouth/Throat: Oropharynx is clear and moist.  Eyes: Conjunctivae normal and EOM are normal. Pupils are equal, round, and reactive to light. Right eye exhibits no discharge. Left eye exhibits no discharge.  Neck: Normal range of motion. Neck supple. No tracheal deviation present. No thyromegaly present.  Cardiovascular: Normal rate, regular rhythm, normal heart sounds and intact distal pulses.   Respiratory: Effort normal and breath sounds normal. No respiratory distress. She has no wheezes.  GI: Soft. Bowel sounds are  normal. She exhibits no distension. There is no tenderness.  Musculoskeletal: Normal range of motion. She exhibits no edema and no tenderness.  Neurological: She is alert. She has normal reflexes. No cranial nerve deficit. Coordination normal.  Skin: Skin is warm and dry. No rash noted. No erythema. No pallor.  Psychiatric: She has a normal mood and affect. Her behavior is normal. Judgment and thought content normal.     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 Plan L3-4 decompressive laminectomy with bilateral L3 and L4 decompressive foraminotomies followed by posterior lumbar interbody fusion utilizing tangent interbody allograft wedge Telamon interbody peek cage and local autographing periodicity coupled with posterior lateral arthrodesis utilizing segmental pedicle screw station from L3-L5. This will require reexploration of her L4-5 fusion with hardware removal. Risks and benefits have been explained. Patient wishes to proceed.  Arrion Burruel A 07/14/2012, 10:21 AM

## 2012-07-14 NOTE — Brief Op Note (Signed)
07/14/2012  2:03 PM  PATIENT:  Fayne Mediate  56 y.o. female  PRE-OPERATIVE DIAGNOSIS:  stenosis  POST-OPERATIVE DIAGNOSIS:  stenosis   PROCEDURE:  Procedure(s) (LRB) with comments: POSTERIOR LUMBAR FUSION 1 WITH HARDWARE REMOVAL (N/A) - lumbar three-four decompressive laminectomy, posterior lumbar interbody fusion, tangent telemon cage, posterior lateral arthrodesis with pedicle screws, removal of M 10 pedicle screws   SURGEON:  Surgeon(s) and Role:    * Temple Pacini, MD - Primary    * Maeola Harman, MD - Assisting  PHYSICIAN ASSISTANT:   ASSISTANTS:    ANESTHESIA:   general  EBL:  Total I/O In: 2000 [I.V.:2000] Out: 900 [Urine:500; Blood:400]  BLOOD ADMINISTERED:none  DRAINS: (Med) Hemovact drain(s) in the Epidural space with  Suction Open   LOCAL MEDICATIONS USED:  MARCAINE     SPECIMEN:  No Specimen  DISPOSITION OF SPECIMEN:  N/A  COUNTS:  YES  TOURNIQUET:  * No tourniquets in log *  DICTATION: .Dragon Dictation  PLAN OF CARE: Admit to inpatient   PATIENT DISPOSITION:  PACU - hemodynamically stable.   Delay start of Pharmacological VTE agent (>24hrs) due to surgical blood loss or risk of bleeding: yes

## 2012-07-14 NOTE — Op Note (Signed)
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  L3 for posterior lumbar interbody fusion utilizing tangent interbody allograft wedge Telamon interbody peek cage and local autograft.  Reexploration of L4-5 posterior lateral fusion with removal of instrumentation.  L3-4-5 posterior lateral arthrodesis utilizing segmental pedicle screw sedation and local autograft  Surgeon:Carlie Corpus A.Arloa Prak, M.D.  Asst. Surgeon: Venetia Maxon  Anesthesia: General  Indication: 56 year old female status post previous L4-5 decompression infusion presents now with adjacent level disease worsening stenosis and instability. She has symptoms of neurogenic claudication and presents for decompression and fusion L3-4.  Operative note: After induction anesthesia, patient positioned prone onto a Wilson frame and appropriately padded. Lumbar region prepped and draped sterilely. Incision made extending from L3 down to L5. Syrup paralysis dissection performed transverse processes and lamina of L3 dissected free as were the pedicle screw station of L4 and L5. Deep self retaining retractor was placed intraoperative fluoroscopy was used and the levels were confirmed. Pedicle screw station L4-5 was disassembled. Old implant screws were removed and replaced with 7.5 x 40 mm Legacy Medtronic screws. Decompressive laminectomies then performed at L3-4 using Leksell orders Veleta Miners is a high-speed drill to remove the entire lamina of L3 inferior facets of L3 bilaterally superior facet of L4 bilaterally and the rudimentary aspect the L4 lamina. Ligament flavum and epidural scar were elevated and resected.  Wide decompressive foraminotomies were performed on of course exiting L3 and L4 nerve roots bilaterally. Bilateral discectomies were then performed at L3-4. The space and distraction up to 8 mm with a millimeter distractor less than patient's right side. Thecal sac and nerve respect on the left side. The spaces and reamed and then cut with 8 mm tangent is Mr. soft tissues removed and interspace. 8 x 22 mm Telamon cage packed with morselized autograft was then impacted in place and recessed approximatel2 mm from the posterior cortical margin of L3. y distractor was moved from the patient's right side. The space was again reamed and then cut with 8 mm tangent is Mr. soft tissues removed and interspace. Morselize autograft and packed into the interspace. An 8 x 26 over tangent wedges and packed into place and recessed roughly 1 mm from the posterior cortical margin of L3. Pedicles of L3 were then identified by surface landmarks and intraoperative fluoroscopy superficial bone overlying the pedicle was removed using high-speed drill each pedicles and probed using pedicle awl each pedicle awl track was then probed and found to be solidly within bone. 6.75 x 40 mm Legacy screws were placed at L3 bilaterally. Transverse processes of L3-4-5 and decorticated high-speed drill. Morselized autograft packed posterior laterally for later fusion. Short segment titanium rods and posterior the screws were locking caps and posterior the screw locking caps and engaged with the construct under pressure. Final images revealed good position the bone graft and hardware at proper upper level with normal lamina spine. Wound is then irrigated out like solution. Gelfoam was placed topically for hemostasis. Medium Hemovac drain was left at per space. Wounds and close in layers with Vicryl sutures. Steri-Strips and sterile dressing were applied. There were no apparent complications. Patient tolerated the procedure well and returned to the  recovery room postop.

## 2012-07-14 NOTE — Anesthesia Preprocedure Evaluation (Addendum)
Anesthesia Evaluation  Patient identified by MRN, date of birth, ID band Patient awake    Reviewed: Allergy & Precautions, H&P , NPO status , Patient's Chart, lab work & pertinent test results  Airway Mallampati: I TM Distance: >3 FB Neck ROM: Full    Dental  (+) Edentulous Upper and Edentulous Lower   Pulmonary          Cardiovascular     Neuro/Psych Anxiety Depression    GI/Hepatic   Endo/Other    Renal/GU      Musculoskeletal   Abdominal   Peds  Hematology   Anesthesia Other Findings   Reproductive/Obstetrics                          Anesthesia Physical Anesthesia Plan  ASA: III  Anesthesia Plan: General   Post-op Pain Management:    Induction: Intravenous  Airway Management Planned: Oral ETT  Additional Equipment:   Intra-op Plan:   Post-operative Plan: Extubation in OR  Informed Consent: I have reviewed the patients History and Physical, chart, labs and discussed the procedure including the risks, benefits and alternatives for the proposed anesthesia with the patient or authorized representative who has indicated his/her understanding and acceptance.     Plan Discussed with: CRNA and Surgeon  Anesthesia Plan Comments:         Anesthesia Quick Evaluation

## 2012-07-14 NOTE — Anesthesia Postprocedure Evaluation (Signed)
Anesthesia Post Note  Patient: Patricia Miles  Procedure(s) Performed: Procedure(s) (LRB): POSTERIOR LUMBAR FUSION 1 WITH HARDWARE REMOVAL (N/A)  Anesthesia type: general  Patient location: PACU  Post pain: Pain level controlled  Post assessment: Patient's Cardiovascular Status Stable  Last Vitals:  Filed Vitals:   07/14/12 1515  BP:   Pulse: 67  Temp:   Resp: 20    Post vital signs: Reviewed and stable  Level of consciousness: sedated  Complications: No apparent anesthesia complications

## 2012-07-14 NOTE — Transfer of Care (Signed)
Immediate Anesthesia Transfer of Care Note  Patient: Patricia Miles  Procedure(s) Performed: Procedure(s) (LRB) with comments: POSTERIOR LUMBAR FUSION 1 WITH HARDWARE REMOVAL (N/A) - lumbar three-four decompressive laminectomy, posterior lumbar interbody fusion, tangent telemon cage, posterior lateral arthrodesis with pedicle screws, removal of M 10 pedicle screws   Patient Location: PACU  Anesthesia Type:General  Level of Consciousness: awake, alert  and oriented  Airway & Oxygen Therapy: Patient Spontanous Breathing and Patient connected to nasal cannula oxygen  Post-op Assessment: Report given to PACU RN, Post -op Vital signs reviewed and stable and Patient moving all extremities  Post vital signs: Reviewed and stable  Complications: No apparent anesthesia complications

## 2012-07-14 NOTE — Preoperative (Signed)
Beta Blockers   Reason not to administer Beta Blockers:B blocker taken 07/14/12 @ 0630

## 2012-07-14 NOTE — Progress Notes (Signed)
Pt. Shaking, states this is normal for her.

## 2012-07-14 NOTE — Anesthesia Procedure Notes (Signed)
Procedure Name: Intubation Date/Time: 07/14/2012 10:57 AM Performed by: Rossie Muskrat L Pre-anesthesia Checklist: Patient identified, Timeout performed, Emergency Drugs available, Suction available and Patient being monitored Patient Re-evaluated:Patient Re-evaluated prior to inductionOxygen Delivery Method: Circle system utilized Preoxygenation: Pre-oxygenation with 100% oxygen Intubation Type: IV induction Ventilation: Mask ventilation without difficulty Laryngoscope Size: Miller and 2 Tube type: Oral Tube size: 7.5 mm Number of attempts: 1 Airway Equipment and Method: Stylet Placement Confirmation: ETT inserted through vocal cords under direct vision,  breath sounds checked- equal and bilateral and positive ETCO2 Secured at: 22 cm Tube secured with: Tape Dental Injury: Teeth and Oropharynx as per pre-operative assessment

## 2012-07-15 MED ORDER — OXYCODONE-ACETAMINOPHEN 5-325 MG PO TABS: 1.0000 | ORAL_TABLET | ORAL | Status: DC | PRN

## 2012-07-15 MED ORDER — METAXALONE 800 MG PO TABS: 800.0000 mg | ORAL_TABLET | Freq: Three times a day (TID) | ORAL | Status: DC

## 2012-07-15 NOTE — Progress Notes (Signed)
UR COMPLETED  

## 2012-07-15 NOTE — Progress Notes (Signed)
Occupational Therapy Evaluation Patient Details Name: Patricia Miles MRN: [id number] DOB: 1956/03/18 Today's Date: 07/15/2012 Time: 4132-4401 OT Time Calculation (min): 28 min  OT Assessment / Plan / Recommendation Clinical Impression  56 yo s/p L3-4 redo decompressive laminectomy with B L3-4 foraminotomies. L3 posterior lumbar fusion. Completed all education with pt and family regarding ADL, use of back brace, postiioning and adhering to back precutions. Pt/family demonstrated and verbalized understanding. Pt has nec DME for D/C. No further OT needed. Husband will be able to provide 24/7 S at D/C.    OT Assessment  Patient does not need any further OT services    Follow Up Recommendations  No OT follow up    Barriers to Discharge  none    Equipment Recommendations  None recommended by OT    Recommendations for Other Services  none  Frequency    eval only   Precautions / Restrictions Precautions Precautions: Back Precaution Booklet Issued: Yes (comment) Precaution Comments: able to vebalize precautions Required Braces or Orthoses: Spinal Brace Spinal Brace: Lumbar corset;Applied in sitting position Restrictions Weight Bearing Restrictions: No   Pertinent Vitals/Pain No c/o pain    ADL  Grooming: Set up Upper Body Bathing: Set up Lower Body Bathing: Supervision/safety Where Assessed - Lower Body Bathing: Unsupported sit to stand Upper Body Dressing: Set up Where Assessed - Upper Body Dressing: Unsupported sitting Lower Body Dressing: Set up Where Assessed - Lower Body Dressing: Unsupported sit to stand Toilet Transfer: Modified independent Toilet Transfer Method: Sit to stand;Stand pivot Acupuncturist: Comfort height toilet Toileting - Clothing Manipulation and Hygiene: Modified independent Where Assessed - Glass blower/designer Manipulation and Hygiene: Standing Equipment Used: Back brace Transfers/Ambulation Related to ADLs: mod I ADL Comments:  Educated pt on backk precautions and ADL and available AE. Pt has nec DME. Rec for pt to get reacher and tongs for hygiene after toileting.    OT Diagnosis:    OT Problem List:   OT Treatment Interventions:     OT Goals Acute Rehab OT Goals OT Goal Formulation:  (eval only)  Visit Information  Last OT Received On: 07/15/12 Assistance Needed: +1    Subjective Data      Prior Functioning     Home Living Lives With: Spouse Available Help at Discharge: Family;Available 24 hours/day Type of Home: House Home Access: Stairs to enter [employer] of Steps: 1 Entrance Stairs-Rails: None Home Layout: One level Bathroom Shower/Tub: Health visitor: Handicapped height Bathroom Accessibility: Yes How Accessible: Accessible via walker Home Adaptive Equipment: Shower chair with back;Straight cane Prior Function Level of Independence: Needs assistance Needs Assistance: Meal Prep;Light Housekeeping;Gait Meal Prep: Moderate Light Housekeeping: Moderate Gait Assistance: Uses cane Able to Take Stairs?: Yes Driving: No Vocation: On disability Communication Communication: No difficulties Dominant Hand: Right         Vision/Perception     Cognition  Overall Cognitive Status: Appears within functional limits for tasks assessed/performed Arousal/Alertness: Awake/alert Orientation Level: Appears intact for tasks assessed Behavior During Session: [facility] for tasks performed Cognition - Other Comments: very talkative    Extremity/Trunk Assessment Right Upper Extremity Assessment RUE ROM/Strength/Tone: Within functional levels RUE Sensation: WFL - Light Touch;WFL - Proprioception RUE Coordination: WFL - gross/fine motor Left Upper Extremity Assessment LUE ROM/Strength/Tone: Within functional levels LUE Sensation: WFL - Light Touch;WFL - Proprioception LUE Coordination: WFL - gross/fine motor Right Lower Extremity Assessment RLE ROM/Strength/Tone: WFL for  tasks assessed RLE Sensation: WFL - Light Touch Left Lower Extremity Assessment LLE  ROM/Strength/Tone: [facility] for tasks assessed LLE Sensation: WFL - Light Touch Trunk Assessment Trunk Assessment: Normal     Mobility Bed Mobility Bed Mobility: Supine to Sit Rolling Right: 6: Modified independent (Device/Increase time) Right Sidelying to Sit: 6: Modified independent (Device/Increase time) Supine to Sit: 6: Modified independent (Device/Increase time) Sit to Sidelying Right: 6: Modified independent (Device/Increase time) Details for Bed Mobility Assistance: pt states she sleeps in recliner Transfers Transfers: Sit to Stand;Stand to Sit Sit to Stand: 6: Modified independent (Device/Increase time);With upper extremity assist;From bed Stand to Sit: 6: Modified independent (Device/Increase time);With upper extremity assist;To chair/3-in-1     Shoulder Instructions     Exercise     Balance Balance Balance Assessed: No   End of Session OT - End of Session Equipment Utilized During Treatment: Back brace Activity Tolerance: Patient tolerated treatment well Patient left: in chair;with call bell/phone within reach;with family/visitor present Nurse Communication: Mobility status  GO     Patricia Miles,Patricia Miles 07/15/2012, 9:47 AM Luisa Dago, OTR/L  [phone] 07/15/2012

## 2012-07-15 NOTE — Evaluation (Signed)
Physical Therapy Evaluation Patient Details Name: Patricia Miles MRN: [id number] DOB: Mar 03, 1956 Today's Date: 07/15/2012 Time: 4098-1191 PT Time Calculation (min): 18 min  PT Assessment / Plan / Recommendation Clinical Impression  pt presents with Redo of L3-4 Lami and Decompression.  Reviewed back precautions and don/doffing brace in sitting per MD order.  ? if pt will follow through with precautions at home?  pt notes having good family support and plan to D/C to home today.  pt would benefit from HHPT at D/C to maximize I and decrease fall risk.      PT Assessment  All further PT needs can be met in the next venue of care    Follow Up Recommendations  Home health PT;Supervision - Intermittent    Does the patient have the potential to tolerate intense rehabilitation      Barriers to Discharge        Equipment Recommendations  None recommended by PT    Recommendations for Other Services     Frequency      Precautions / Restrictions Precautions Precautions: Back Precaution Booklet Issued: Yes (comment) Required Braces or Orthoses: Spinal Brace Spinal Brace: Lumbar corset;Applied in sitting position Restrictions Weight Bearing Restrictions: No   Pertinent Vitals/Pain Pt indicates pain is "not bad".      Mobility  Bed Mobility Bed Mobility: Rolling Right;Right Sidelying to Sit;Sit to Sidelying Right Rolling Right: 6: Modified independent (Device/Increase time) Right Sidelying to Sit: 6: Modified independent (Device/Increase time) Sit to Sidelying Right: 6: Modified independent (Device/Increase time) Transfers Transfers: Sit to Stand;Stand to Sit Sit to Stand: 6: Modified independent (Device/Increase time);With upper extremity assist;From bed Stand to Sit: 6: Modified independent (Device/Increase time);With upper extremity assist;To bed Ambulation/Gait Ambulation/Gait Assistance: 5: Supervision Ambulation Distance (Feet): 200 Feet Assistive device:  (Occasional  use of rail) Ambulation/Gait Assistance Details: pt mildly unsteady and occasional use of rail, but indicates she has been using a cane at home for some time.  No LOB.   Gait Pattern: Step-through pattern;Decreased stride length Stairs: No Wheelchair Mobility Wheelchair Mobility: No    Shoulder Instructions     Exercises     PT Diagnosis: Abnormality of gait  PT Problem List: Decreased activity tolerance;Decreased balance;Decreased mobility;Decreased knowledge of use of DME;Decreased knowledge of precautions PT Treatment Interventions:     PT Goals    Visit Information  Last PT Received On: 07/15/12 Assistance Needed: +1    Subjective Data  Subjective: This is my 3rd back surgery.   Patient Stated Goal: Home   Prior Functioning  Home Living Lives With: Spouse Available Help at Discharge: Family;Available 24 hours/day Type of Home: House Home Access: Stairs to enter [employer] of Steps: 1 Entrance Stairs-Rails: None Home Layout: One level Bathroom Shower/Tub: Health visitor: Handicapped height Bathroom Accessibility: Yes How Accessible: Accessible via walker Home Adaptive Equipment: Shower chair with back;Straight cane Prior Function Level of Independence: Needs assistance Needs Assistance: Meal Prep;Light Housekeeping;Gait Meal Prep: Moderate Light Housekeeping: Moderate Gait Assistance: Uses cane Able to Take Stairs?: Yes Driving: No Vocation: On disability Communication Communication: No difficulties    Cognition  Overall Cognitive Status: Appears within functional limits for tasks assessed/performed Arousal/Alertness: Awake/alert Orientation Level: Appears intact for tasks assessed Behavior During Session: [facility] for tasks performed    Extremity/Trunk Assessment Right Lower Extremity Assessment RLE ROM/Strength/Tone: [facility] for tasks assessed RLE Sensation: WFL - Light Touch Left Lower Extremity Assessment LLE ROM/Strength/Tone:  WFL for tasks assessed LLE Sensation: WFL - Light Touch Trunk  Assessment Trunk Assessment: Normal   Balance Balance Balance Assessed: No  End of Session PT - End of Session Equipment Utilized During Treatment: Gait belt;Back brace Activity Tolerance: Patient tolerated treatment well Patient left: in bed;with call bell/phone within reach Nurse Communication: Mobility status  GP     Sunny Schlein, [place] [phone] 07/15/2012, 8:44 AM

## 2012-07-15 NOTE — Discharge Summary (Signed)
Physician Discharge Summary  Patient ID: Patricia Miles MRN: [id number] DOB/AGE: [birth year] 55 y.o.  Admit date: 07/14/2012 Discharge date: 07/15/2012  Admission Diagnoses:  Discharge Diagnoses:  Principal Problem:  *Lumbar stenosis with neurogenic claudication   Discharged Condition: good  Hospital Course: Patient admitted to the hospital where she underwent uncomplicated L3-L5 fusion. Postoperative she is done well. Back and lower extremity pain much improved. Strength cessation intact. Wound healing well. Ready for discharge.  Consults:   Significant Diagnostic Studies:   Treatments:   Discharge Exam: Blood pressure 100/58, pulse 79, temperature 97.5 F (36.4 C), temperature source Oral, resp. rate 20, SpO2 94.00%. Awake and alert oriented and appropriate. Cranial nerve function is intact. Motor and sensory function extremities normal. Wound clean dry and intact. Chest and abdomen benign.  Disposition:      Medication List     As of 07/15/2012 12:36 PM    TAKE these medications         atenolol 25 MG tablet   Commonly known as: TENORMIN   Take 25 mg by mouth daily.      busPIRone 15 MG tablet   Commonly known as: BUSPAR   Take 15 mg by mouth 3 (three) times daily.      CALCIUM PO   Take 1 tablet by mouth daily.      citalopram 20 MG tablet   Commonly known as: CELEXA   Take 20 mg by mouth daily.      clorazepate 3.75 MG tablet   Commonly known as: TRANXENE   Take 7.5 mg by mouth 2 (two) times daily.      fish oil-omega-3 fatty acids 1000 MG capsule   Take 1 g by mouth daily.      gabapentin 100 MG capsule   Commonly known as: NEURONTIN   Take 200-300 mg by mouth 3 (three) times daily. Take 200mg  in AM and PM and 300mg  at Bedtime      lamoTRIgine 200 MG tablet   Commonly known as: LAMICTAL   Take 200 mg by mouth daily.      metaxalone 800 MG tablet   Commonly known as: SKELAXIN   Take 1 tablet (800 mg total) by mouth 3 (three) times daily.        multivitamin with minerals Tabs   Take 1 tablet by mouth daily.      oxyCODONE-acetaminophen 5-325 MG per tablet   Commonly known as: PERCOCET/ROXICET   Take 1-2 tablets by mouth every 4 (four) hours as needed.      pregabalin 75 MG capsule   Commonly known as: LYRICA   Take 75 mg by mouth 2 (two) times daily.      QUEtiapine 300 MG tablet   Commonly known as: SEROQUEL   Take 300 mg by mouth at bedtime.      simvastatin 40 MG tablet   Commonly known as: ZOCOR   Take 40 mg by mouth every evening.      venlafaxine 75 MG tablet   Commonly known as: EFFEXOR   Take 75 mg by mouth 3 (three) times daily.      VITAMIN E PO   Take 1 capsule by mouth daily.           Follow-up Information    Follow up with Dalen Hennessee A, MD. Call in 1 week.   Contact information:   [address] [place] [address] [phone]          Signed: Julio Sicks A  07/15/2012, 12:36 PM

## 2012-07-15 NOTE — Progress Notes (Signed)
Orthopedic Tech Progress Note Patient Details:  Patricia Miles 05/17/1956 [id number]  Patient ID: Patricia Miles, female   DOB: 01/03/1956, 56 y.o.   MRN: [id number]   Patricia Miles 07/15/2012, 1:35 PM PT HAS BRACE ON

## 2012-07-22 MED FILL — Sodium Chloride Irrigation Soln 0.9%: Qty: 3000 | Status: AC

## 2012-07-22 MED FILL — Heparin Sodium (Porcine) Inj 1000 Unit/ML: INTRAMUSCULAR | Qty: 30 | Status: AC

## 2012-07-22 MED FILL — Sodium Chloride IV Soln 0.9%: INTRAVENOUS | Qty: 1000 | Status: AC

## 2012-10-12 ENCOUNTER — Other Ambulatory Visit: Payer: Self-pay | Admitting: Neurosurgery

## 2012-10-14 ENCOUNTER — Encounter (HOSPITAL_COMMUNITY): Payer: Self-pay | Admitting: Pharmacy Technician

## 2012-10-16 ENCOUNTER — Encounter (HOSPITAL_COMMUNITY): Payer: Self-pay

## 2012-10-16 ENCOUNTER — Encounter (HOSPITAL_COMMUNITY)
Admission: RE | Admit: 2012-10-16 | Discharge: 2012-10-16 | Disposition: A | Discharge status: Home / Self Care | Payer: Worker's Compensation | Source: Ambulatory Visit | Attending: Neurosurgery | Admitting: Neurosurgery

## 2012-10-16 LAB — BASIC METABOLIC PANEL
BUN: 11 mg/dL (ref 6–23)
Calcium: 9.7 mg/dL (ref 8.4–10.5)
Creatinine, Ser: 0.88 mg/dL (ref 0.50–1.10)
GFR calc Af Amer: 84 mL/min — ABNORMAL LOW (ref >90)
GFR calc non Af Amer: 73 mL/min — ABNORMAL LOW (ref >90)
Glucose, Bld: 89 mg/dL (ref 70–99)

## 2012-10-16 LAB — SURGICAL PCR SCREEN: MRSA, PCR: NEGATIVE

## 2012-10-16 LAB — CBC WITH DIFFERENTIAL/PLATELET
Basophils Absolute: 0 10*3/uL (ref 0.0–0.1)
Basophils Relative: 0 % (ref 0–1)
Eosinophils Absolute: 0.2 10*3/uL (ref 0.0–0.7)
MCH: 30 pg (ref 26.0–34.0)
MCHC: 32.4 g/dL (ref 30.0–36.0)
Monocytes Absolute: 0.6 10*3/uL (ref 0.1–1.0)
Monocytes Relative: 7 % (ref 3–12)
Neutro Abs: 5.2 10*3/uL (ref 1.7–7.7)
Neutrophils Relative %: 55 % (ref 43–77)
RDW: 13.1 % (ref 11.5–15.5)

## 2012-10-16 NOTE — Progress Notes (Signed)
Pt. Takes atenolol is one of her psychiatric medicines.

## 2012-10-16 NOTE — Pre-Procedure Instructions (Signed)
DEWAYNE JUREK  10/16/2012   Your procedure is scheduled on:  Monday, October 19, 2012  Report to [facility] at 1:00 AM.  Call this number if you have problems the morning of surgery: [phone]   Remember:   Do not eat food or drink liquids after midnight.   Take these medicines the morning of surgery with A SIP OF WATER: atenolol, buspar,celexa, gabapentin, pain pill,lamictal, tranxene   Do not wear jewelry, make-up or nail polish.  Do not wear lotions, powders, or perfumes. You may wear deodorant.  Do not shave 48 hours prior to surgery. Men may shave face and neck.  Do not bring valuables to the hospital.  Contacts, dentures or bridgework may not be worn into surgery.  Leave suitcase in the car. After surgery it may be brought to your room.  For patients admitted to the hospital, checkout time is 11:00 AM the day of  discharge.   Patients discharged the day of surgery will not be allowed to drive  home.  Name and phone number of your driver:   Special Instructions: Shower using CHG 2 nights before surgery and the night before surgery.  If you shower the day of surgery use CHG.  Use special wash - you have one bottle of CHG for all showers.  You should use approximately 1/3 of the bottle for each shower.   Please read over the following fact sheets that you were given: Pain Booklet, Coughing and Deep Breathing and MRSA Information

## 2012-10-18 MED ORDER — CEFAZOLIN SODIUM-DEXTROSE 2-3 GM-% IV SOLR
2.0000 g | INTRAVENOUS | Status: AC
  Administered 2012-10-19: 2 g via INTRAVENOUS
  Filled 2012-10-18: qty 50

## 2012-10-19 ENCOUNTER — Inpatient Hospital Stay (HOSPITAL_COMMUNITY): Payer: Worker's Compensation | Admitting: Anesthesiology

## 2012-10-19 ENCOUNTER — Encounter (HOSPITAL_COMMUNITY): Payer: Self-pay | Admitting: Anesthesiology

## 2012-10-19 ENCOUNTER — Encounter (HOSPITAL_COMMUNITY)
Admission: RE | Disposition: A | Discharge status: Home / Self Care | Payer: Self-pay | Source: Ambulatory Visit | Attending: Neurosurgery

## 2012-10-19 ENCOUNTER — Inpatient Hospital Stay (HOSPITAL_COMMUNITY)
Admission: RE | Admit: 2012-10-19 | Discharge: 2012-10-21 | DRG: 029 | Disposition: A | Discharge status: Home / Self Care | Payer: Worker's Compensation | Source: Ambulatory Visit | Attending: Neurosurgery | Admitting: Neurosurgery

## 2012-10-19 DIAGNOSIS — Z79899 Other long term (current) drug therapy: Secondary | ICD-10-CM

## 2012-10-19 DIAGNOSIS — F411 Generalized anxiety disorder: Secondary | ICD-10-CM | POA: Diagnosis present

## 2012-10-19 DIAGNOSIS — Y838 Other surgical procedures as the cause of abnormal reaction of the patient, or of later complication, without mention of misadventure at the time of the procedure: Secondary | ICD-10-CM | POA: Diagnosis present

## 2012-10-19 DIAGNOSIS — E785 Hyperlipidemia, unspecified: Secondary | ICD-10-CM | POA: Diagnosis present

## 2012-10-19 DIAGNOSIS — F3130 Bipolar disorder, current episode depressed, mild or moderate severity, unspecified: Secondary | ICD-10-CM | POA: Diagnosis present

## 2012-10-19 DIAGNOSIS — J449 Chronic obstructive pulmonary disease, unspecified: Secondary | ICD-10-CM | POA: Diagnosis present

## 2012-10-19 DIAGNOSIS — G9601 Cranial cerebrospinal fluid leak, spontaneous: Principal | ICD-10-CM | POA: Diagnosis present

## 2012-10-19 DIAGNOSIS — J4489 Other specified chronic obstructive pulmonary disease: Secondary | ICD-10-CM | POA: Diagnosis present

## 2012-10-19 DIAGNOSIS — G96198 Other disorders of meninges, not elsewhere classified: Secondary | ICD-10-CM

## 2012-10-19 DIAGNOSIS — Z87891 Personal history of nicotine dependence: Secondary | ICD-10-CM

## 2012-10-19 DIAGNOSIS — G969 Disorder of central nervous system, unspecified: Secondary | ICD-10-CM | POA: Diagnosis present

## 2012-10-19 HISTORY — PX: LUMBAR WOUND DEBRIDEMENT: SHX1988

## 2012-10-19 SURGERY — LUMBAR WOUND DEBRIDEMENT
Anesthesia: General | Site: Spine Lumbar | Wound class: Clean

## 2012-10-19 MED ORDER — HYDROMORPHONE HCL PF 1 MG/ML IJ SOLN
INTRAMUSCULAR | Status: AC
  Filled 2012-10-19: qty 1

## 2012-10-19 MED ORDER — PHENOL 1.4 % MT LIQD: 1.0000 | OROMUCOSAL | Status: DC | PRN

## 2012-10-19 MED ORDER — QUETIAPINE FUMARATE 300 MG PO TABS
300.0000 mg | ORAL_TABLET | Freq: Every day | ORAL | Status: DC
  Administered 2012-10-19 – 2012-10-20 (×2): 300 mg via ORAL
  Filled 2012-10-19 (×3): qty 1

## 2012-10-19 MED ORDER — MENTHOL 3 MG MT LOZG: 1.0000 | LOZENGE | OROMUCOSAL | Status: DC | PRN

## 2012-10-19 MED ORDER — CITALOPRAM HYDROBROMIDE 20 MG PO TABS
20.0000 mg | ORAL_TABLET | Freq: Every day | ORAL | Status: DC
  Administered 2012-10-20 – 2012-10-21 (×2): 20 mg via ORAL
  Filled 2012-10-19 (×2): qty 1

## 2012-10-19 MED ORDER — SODIUM CHLORIDE 0.9 % IV SOLN
250.0000 mL | INTRAVENOUS | Status: DC
  Administered 2012-10-19: 250 mL via INTRAVENOUS

## 2012-10-19 MED ORDER — SODIUM CHLORIDE 0.9 % IJ SOLN
3.0000 mL | Freq: Two times a day (BID) | INTRAMUSCULAR | Status: DC
  Administered 2012-10-20: 3 mL via INTRAVENOUS

## 2012-10-19 MED ORDER — ADULT MULTIVITAMIN W/MINERALS CH
1.0000 | ORAL_TABLET | Freq: Every day | ORAL | Status: DC
  Administered 2012-10-20 – 2012-10-21 (×2): 1 via ORAL
  Filled 2012-10-19 (×2): qty 1

## 2012-10-19 MED ORDER — PROPOFOL 10 MG/ML IV BOLUS
INTRAVENOUS | Status: DC | PRN
  Administered 2012-10-19: 200 mg via INTRAVENOUS

## 2012-10-19 MED ORDER — POLYETHYLENE GLYCOL 3350 17 G PO PACK
17.0000 g | PACK | Freq: Every day | ORAL | Status: DC | PRN
  Filled 2012-10-19: qty 1

## 2012-10-19 MED ORDER — 0.9 % SODIUM CHLORIDE (POUR BTL) OPTIME
TOPICAL | Status: DC | PRN
  Administered 2012-10-19: 1000 mL

## 2012-10-19 MED ORDER — HYDROMORPHONE HCL PF 1 MG/ML IJ SOLN
INTRAMUSCULAR | Status: AC
  Administered 2012-10-19: 0.5 mg
  Filled 2012-10-19: qty 1

## 2012-10-19 MED ORDER — BACITRACIN 50000 UNITS IM SOLR
INTRAMUSCULAR | Status: AC
  Filled 2012-10-19: qty 1

## 2012-10-19 MED ORDER — HYDROCODONE-ACETAMINOPHEN 5-325 MG PO TABS: 1.0000 | ORAL_TABLET | ORAL | Status: DC | PRN

## 2012-10-19 MED ORDER — ATENOLOL 25 MG PO TABS
25.0000 mg | ORAL_TABLET | Freq: Every day | ORAL | Status: DC
  Administered 2012-10-20: 25 mg via ORAL
  Filled 2012-10-19 (×2): qty 1

## 2012-10-19 MED ORDER — GABAPENTIN 100 MG PO CAPS
100.0000 mg | ORAL_CAPSULE | Freq: Two times a day (BID) | ORAL | Status: DC
  Administered 2012-10-20 – 2012-10-21 (×3): 100 mg via ORAL
  Filled 2012-10-19 (×6): qty 1

## 2012-10-19 MED ORDER — OMEGA-3-ACID ETHYL ESTERS 1 G PO CAPS
1.0000 g | ORAL_CAPSULE | Freq: Every day | ORAL | Status: DC
  Administered 2012-10-20 – 2012-10-21 (×2): 1 g via ORAL
  Filled 2012-10-19 (×2): qty 1

## 2012-10-19 MED ORDER — SODIUM CHLORIDE 0.9 % IV SOLN
INTRAVENOUS | Status: AC
  Filled 2012-10-19: qty 500

## 2012-10-19 MED ORDER — BUSPIRONE HCL 15 MG PO TABS
15.0000 mg | ORAL_TABLET | Freq: Three times a day (TID) | ORAL | Status: DC
  Administered 2012-10-19 – 2012-10-21 (×5): 15 mg via ORAL
  Filled 2012-10-19 (×7): qty 1

## 2012-10-19 MED ORDER — ONDANSETRON HCL 4 MG/2ML IJ SOLN: 4.0000 mg | Freq: Once | INTRAMUSCULAR | Status: DC | PRN

## 2012-10-19 MED ORDER — GABAPENTIN 100 MG PO CAPS: 200.0000 mg | ORAL_CAPSULE | Freq: Three times a day (TID) | ORAL | Status: DC

## 2012-10-19 MED ORDER — KETOROLAC TROMETHAMINE 30 MG/ML IJ SOLN
30.0000 mg | Freq: Four times a day (QID) | INTRAMUSCULAR | Status: DC
  Administered 2012-10-19 – 2012-10-21 (×6): 30 mg via INTRAVENOUS
  Filled 2012-10-19 (×10): qty 1

## 2012-10-19 MED ORDER — CEFAZOLIN SODIUM 1-5 GM-% IV SOLN
1.0000 g | Freq: Three times a day (TID) | INTRAVENOUS | Status: AC
  Administered 2012-10-19 – 2012-10-20 (×2): 1 g via INTRAVENOUS
  Filled 2012-10-19 (×2): qty 50

## 2012-10-19 MED ORDER — VENLAFAXINE HCL 75 MG PO TABS
75.0000 mg | ORAL_TABLET | Freq: Three times a day (TID) | ORAL | Status: DC
  Administered 2012-10-19 – 2012-10-21 (×5): 75 mg via ORAL
  Filled 2012-10-19 (×7): qty 1

## 2012-10-19 MED ORDER — FLEET ENEMA 7-19 GM/118ML RE ENEM: 1.0000 | ENEMA | Freq: Once | RECTAL | Status: AC | PRN

## 2012-10-19 MED ORDER — ALUM & MAG HYDROXIDE-SIMETH 200-200-20 MG/5ML PO SUSP: 30.0000 mL | Freq: Four times a day (QID) | ORAL | Status: DC | PRN

## 2012-10-19 MED ORDER — ZOLPIDEM TARTRATE 5 MG PO TABS: 5.0000 mg | ORAL_TABLET | Freq: Every evening | ORAL | Status: DC | PRN

## 2012-10-19 MED ORDER — HYDROMORPHONE HCL PF 1 MG/ML IJ SOLN
0.2500 mg | INTRAMUSCULAR | Status: DC | PRN
  Administered 2012-10-19 (×2): 0.5 mg via INTRAVENOUS

## 2012-10-19 MED ORDER — OXYCODONE-ACETAMINOPHEN 5-325 MG PO TABS
1.0000 | ORAL_TABLET | ORAL | Status: DC | PRN
  Administered 2012-10-20 (×3): 1 via ORAL
  Filled 2012-10-19: qty 2
  Filled 2012-10-19: qty 1
  Filled 2012-10-19 (×2): qty 2

## 2012-10-19 MED ORDER — PHENYLEPHRINE HCL 10 MG/ML IJ SOLN
INTRAMUSCULAR | Status: DC | PRN
  Administered 2012-10-19 (×4): 80 ug via INTRAVENOUS

## 2012-10-19 MED ORDER — LACTATED RINGERS IV SOLN
INTRAVENOUS | Status: DC | PRN
  Administered 2012-10-19 (×2): via INTRAVENOUS

## 2012-10-19 MED ORDER — CLORAZEPATE DIPOTASSIUM 3.75 MG PO TABS
7.5000 mg | ORAL_TABLET | Freq: Two times a day (BID) | ORAL | Status: DC
  Administered 2012-10-19 – 2012-10-21 (×4): 7.5 mg via ORAL
  Filled 2012-10-19 (×4): qty 2

## 2012-10-19 MED ORDER — PREGABALIN 75 MG PO CAPS
75.0000 mg | ORAL_CAPSULE | Freq: Two times a day (BID) | ORAL | Status: DC
  Administered 2012-10-19 – 2012-10-21 (×4): 75 mg via ORAL
  Filled 2012-10-19 (×4): qty 1

## 2012-10-19 MED ORDER — DEXAMETHASONE SODIUM PHOSPHATE 10 MG/ML IJ SOLN
10.0000 mg | INTRAMUSCULAR | Status: AC
  Administered 2012-10-19: 10 mg via INTRAVENOUS
  Filled 2012-10-19: qty 1

## 2012-10-19 MED ORDER — SENNA 8.6 MG PO TABS
1.0000 | ORAL_TABLET | Freq: Two times a day (BID) | ORAL | Status: DC
  Administered 2012-10-20 – 2012-10-21 (×4): 8.6 mg via ORAL
  Filled 2012-10-19 (×7): qty 1

## 2012-10-19 MED ORDER — SUCCINYLCHOLINE CHLORIDE 20 MG/ML IJ SOLN
INTRAMUSCULAR | Status: DC | PRN
  Administered 2012-10-19: 100 mg via INTRAVENOUS

## 2012-10-19 MED ORDER — FENTANYL CITRATE 0.05 MG/ML IJ SOLN
INTRAMUSCULAR | Status: DC | PRN
  Administered 2012-10-19 (×3): 100 ug via INTRAVENOUS

## 2012-10-19 MED ORDER — BISACODYL 10 MG RE SUPP: 10.0000 mg | Freq: Every day | RECTAL | Status: DC | PRN

## 2012-10-19 MED ORDER — LAMOTRIGINE 200 MG PO TABS
200.0000 mg | ORAL_TABLET | Freq: Every day | ORAL | Status: DC
  Administered 2012-10-20 – 2012-10-21 (×2): 200 mg via ORAL
  Filled 2012-10-19 (×2): qty 1

## 2012-10-19 MED ORDER — SODIUM CHLORIDE 0.9 % IR SOLN
Status: DC | PRN
  Administered 2012-10-19: 16:00:00

## 2012-10-19 MED ORDER — DIAZEPAM 5 MG PO TABS: 5.0000 mg | ORAL_TABLET | Freq: Four times a day (QID) | ORAL | Status: DC | PRN

## 2012-10-19 MED ORDER — OMEGA-3 FATTY ACIDS 1000 MG PO CAPS: 1.0000 g | ORAL_CAPSULE | Freq: Every day | ORAL | Status: DC

## 2012-10-19 MED ORDER — ACETAMINOPHEN 650 MG RE SUPP: 650.0000 mg | RECTAL | Status: DC | PRN

## 2012-10-19 MED ORDER — EPHEDRINE SULFATE 50 MG/ML IJ SOLN
INTRAMUSCULAR | Status: DC | PRN
  Administered 2012-10-19 (×4): 10 mg via INTRAVENOUS

## 2012-10-19 MED ORDER — LIDOCAINE HCL (CARDIAC) 20 MG/ML IV SOLN
INTRAVENOUS | Status: DC | PRN
  Administered 2012-10-19: 100 mg via INTRAVENOUS

## 2012-10-19 MED ORDER — GABAPENTIN 300 MG PO CAPS
300.0000 mg | ORAL_CAPSULE | Freq: Every day | ORAL | Status: DC
  Administered 2012-10-19 – 2012-10-20 (×2): 300 mg via ORAL
  Filled 2012-10-19 (×3): qty 1

## 2012-10-19 MED ORDER — SODIUM CHLORIDE 0.9 % IJ SOLN: 3.0000 mL | INTRAMUSCULAR | Status: DC | PRN

## 2012-10-19 MED ORDER — MIDAZOLAM HCL 5 MG/5ML IJ SOLN
INTRAMUSCULAR | Status: DC | PRN
  Administered 2012-10-19: 1 mg via INTRAVENOUS

## 2012-10-19 MED ORDER — ACETAMINOPHEN 325 MG PO TABS: 650.0000 mg | ORAL_TABLET | ORAL | Status: DC | PRN

## 2012-10-19 MED ORDER — ONDANSETRON HCL 4 MG/2ML IJ SOLN: 4.0000 mg | INTRAMUSCULAR | Status: DC | PRN

## 2012-10-19 MED ORDER — SIMVASTATIN 40 MG PO TABS
40.0000 mg | ORAL_TABLET | Freq: Every day | ORAL | Status: DC
Start: 2012-10-19 — End: 2012-10-21
  Administered 2012-10-19 – 2012-10-20 (×2): 40 mg via ORAL
  Filled 2012-10-19 (×3): qty 1

## 2012-10-19 MED ORDER — THROMBIN 5000 UNITS EX KIT
PACK | CUTANEOUS | Status: DC | PRN
  Administered 2012-10-19 (×2): 5000 [IU] via TOPICAL

## 2012-10-19 MED ORDER — HEMOSTATIC AGENTS (NO CHARGE) OPTIME
TOPICAL | Status: DC | PRN
  Administered 2012-10-19: 1 via TOPICAL

## 2012-10-19 MED ORDER — HYDROMORPHONE HCL PF 1 MG/ML IJ SOLN: 0.5000 mg | INTRAMUSCULAR | Status: DC | PRN

## 2012-10-19 SURGICAL SUPPLY — 65 items
ADH SKN CLS APL DERMABOND .7 (GAUZE/BANDAGES/DRESSINGS)
ADH SKN CLS LQ APL DERMABOND (GAUZE/BANDAGES/DRESSINGS) ×1
APL SKNCLS STERI-STRIP NONHPOA (GAUZE/BANDAGES/DRESSINGS) ×1
BAG DECANTER FOR FLEXI CONT (MISCELLANEOUS) ×2 IMPLANT
BENZOIN TINCTURE PRP APPL 2/3 (GAUZE/BANDAGES/DRESSINGS) ×2 IMPLANT
BLADE SURG ROTATE 9660 (MISCELLANEOUS) IMPLANT
BRUSH SCRUB EZ PLAIN DRY (MISCELLANEOUS) ×2 IMPLANT
CANISTER SUCTION 2500CC (MISCELLANEOUS) ×2 IMPLANT
CLOTH BEACON ORANGE TIMEOUT ST (SAFETY) ×2 IMPLANT
CONT SPEC 4OZ CLIKSEAL STRL BL (MISCELLANEOUS) ×2 IMPLANT
DERMABOND ADHESIVE PROPEN (GAUZE/BANDAGES/DRESSINGS) ×1
DERMABOND ADVANCED (GAUZE/BANDAGES/DRESSINGS)
DERMABOND ADVANCED .7 DNX12 (GAUZE/BANDAGES/DRESSINGS) ×1 IMPLANT
DERMABOND ADVANCED .7 DNX6 (GAUZE/BANDAGES/DRESSINGS) IMPLANT
DRAPE LAPAROTOMY 100X72X124 (DRAPES) ×2 IMPLANT
DRAPE MICROSCOPE LEICA (MISCELLANEOUS) ×1 IMPLANT
DRAPE POUCH INSTRU U-SHP 10X18 (DRAPES) ×2 IMPLANT
DRAPE SURG 17X23 STRL (DRAPES) IMPLANT
DURAPREP 26ML APPLICATOR (WOUND CARE) ×1 IMPLANT
DURASEAL SPINE SEALANT 3ML (MISCELLANEOUS) ×1 IMPLANT
ELECT REM PT RETURN 9FT ADLT (ELECTROSURGICAL) ×2
ELECTRODE REM PT RTRN 9FT ADLT (ELECTROSURGICAL) ×1 IMPLANT
EVACUATOR 1/8 PVC DRAIN (DRAIN) IMPLANT
EXTENDED TIP APPLICATOR ×1 IMPLANT
GAUZE SPONGE 4X4 16PLY XRAY LF (GAUZE/BANDAGES/DRESSINGS) IMPLANT
GLOVE BIO SURGEON STRL SZ 6.5 (GLOVE) ×2 IMPLANT
GLOVE BIO SURGEON STRL SZ8 (GLOVE) ×1 IMPLANT
GLOVE BIOGEL PI IND STRL 6.5 (GLOVE) IMPLANT
GLOVE BIOGEL PI IND STRL 7.0 (GLOVE) IMPLANT
GLOVE BIOGEL PI INDICATOR 6.5 (GLOVE) ×1
GLOVE BIOGEL PI INDICATOR 7.0 (GLOVE) ×1
GLOVE ECLIPSE 8.5 STRL (GLOVE) ×2 IMPLANT
GLOVE EXAM NITRILE LRG STRL (GLOVE) IMPLANT
GLOVE EXAM NITRILE MD LF STRL (GLOVE) ×1 IMPLANT
GLOVE EXAM NITRILE XL STR (GLOVE) IMPLANT
GLOVE EXAM NITRILE XS STR PU (GLOVE) IMPLANT
GLOVE INDICATOR 8.5 STRL (GLOVE) ×1 IMPLANT
GLOVE SURG SS PI 7.0 STRL IVOR (GLOVE) ×1 IMPLANT
GOWN BRE IMP SLV AUR LG STRL (GOWN DISPOSABLE) ×1 IMPLANT
GOWN BRE IMP SLV AUR XL STRL (GOWN DISPOSABLE) ×5 IMPLANT
GOWN STRL REIN 2XL LVL4 (GOWN DISPOSABLE) IMPLANT
KIT BASIN OR (CUSTOM PROCEDURE TRAY) ×2 IMPLANT
KIT ROOM TURNOVER OR (KITS) ×2 IMPLANT
NDL HYPO 25X1 1.5 SAFETY (NEEDLE) IMPLANT
NEEDLE HYPO 25X1 1.5 SAFETY (NEEDLE) IMPLANT
NS IRRIG 1000ML POUR BTL (IV SOLUTION) ×2 IMPLANT
PACK LAMINECTOMY NEURO (CUSTOM PROCEDURE TRAY) ×2 IMPLANT
RUBBERBAND STERILE (MISCELLANEOUS) ×2 IMPLANT
SENSORCAINE 0.25% IMPLANT
SPONGE GAUZE 4X4 12PLY (GAUZE/BANDAGES/DRESSINGS) ×2 IMPLANT
SPONGE SURGIFOAM ABS GEL SZ50 (HEMOSTASIS) ×2 IMPLANT
STRIP CLOSURE SKIN 1/2X4 (GAUZE/BANDAGES/DRESSINGS) ×2 IMPLANT
SUT PROLENE 5 0 C1 (SUTURE) ×1 IMPLANT
SUT PROLENE 6 0 BV (SUTURE) ×2 IMPLANT
SUT VIC AB 0 CT1 18XCR BRD8 (SUTURE) ×1 IMPLANT
SUT VIC AB 0 CT1 8-18 (SUTURE) ×4
SUT VIC AB 2-0 CT1 18 (SUTURE) ×3 IMPLANT
SUT VIC AB 3-0 SH 8-18 (SUTURE) ×1 IMPLANT
SWAB CULTURE LIQ STUART DBL (MISCELLANEOUS) ×1 IMPLANT
SYR 20ML ECCENTRIC (SYRINGE) ×2 IMPLANT
TAPE CLOTH SURG 4X10 WHT LF (GAUZE/BANDAGES/DRESSINGS) ×1 IMPLANT
TOWEL OR 17X24 6PK STRL BLUE (TOWEL DISPOSABLE) ×2 IMPLANT
TOWEL OR 17X26 10 PK STRL BLUE (TOWEL DISPOSABLE) ×2 IMPLANT
TUBE ANAEROBIC SPECIMEN COL (MISCELLANEOUS) ×1 IMPLANT
WATER STERILE IRR 1000ML POUR (IV SOLUTION) ×2 IMPLANT

## 2012-10-19 NOTE — Anesthesia Preprocedure Evaluation (Signed)
Anesthesia Evaluation  Patient identified by MRN, date of birth, ID band Patient awake    Reviewed: Allergy & Precautions, H&P , NPO status , Patient's Chart, lab work & pertinent test results  Airway Mallampati: I TM Distance: >3 FB Neck ROM: full    Dental   Pulmonary COPD         Cardiovascular Rhythm:regular Rate:Normal     Neuro/Psych PSYCHIATRIC DISORDERS Anxiety Depression Bipolar Disorder  Neuromuscular disease    GI/Hepatic   Endo/Other    Renal/GU      Musculoskeletal   Abdominal   Peds  Hematology   Anesthesia Other Findings   Reproductive/Obstetrics                           Anesthesia Physical Anesthesia Plan  ASA: II  Anesthesia Plan: General   Post-op Pain Management:    Induction: Intravenous  Airway Management Planned: Oral ETT  Additional Equipment:   Intra-op Plan:   Post-operative Plan: Extubation in OR  Informed Consent: I have reviewed the patients History and Physical, chart, labs and discussed the procedure including the risks, benefits and alternatives for the proposed anesthesia with the patient or authorized representative who has indicated his/her understanding and acceptance.     Plan Discussed with: CRNA, Anesthesiologist and Surgeon  Anesthesia Plan Comments:         Anesthesia Quick Evaluation

## 2012-10-19 NOTE — H&P (Signed)
Patricia Miles is an 57 y.o. female.   Chief Complaint: Back and leg pain HPI: 57 year old woman proximally 6 weeks status post L3 for decompression infusion for treatment of stenosis. Postoperative she had done well but is having increasing back pain with radiation to her lower chamois. Workup has demonstrated evidence of an enlarging pseudomeningocele. Patient presents now for reexploration of her wound and repair of dural fistula.  Past Medical History  Diagnosis Date  . Hyperlipemia   . Anxiety   . Depression   . Neuromuscular disorder   . Bipolar disorder   . COPD (chronic obstructive pulmonary disease)     6 years ago, stopped smoking and ok now  . Pneumonia     no current issues    Past Surgical History  Procedure Date  . Abdominal hysterectomy   . Back surgery     x 2  . Carpal tunnel release   . Tubal ligation   . Knee arthroscopy     bilateral knee  . Breast biopsy     No family history on file. Social History:  reports that she quit smoking about 7 years ago. Her smoking use included Cigarettes. She does not have any smokeless tobacco history on file. She reports that she does not drink alcohol or use illicit drugs.  Allergies:  Allergies  Allergen Reactions  . Nalfon (Fenoprofen Calcium) Other (See Comments)    Reaction unknown    Medications Prior to Admission  Medication Sig Dispense Refill  . atenolol (TENORMIN) 25 MG tablet Take 25 mg by mouth daily.      . busPIRone (BUSPAR) 15 MG tablet Take 15 mg by mouth 3 (three) times daily.      Marland Kitchen CALCIUM PO Take 1 tablet by mouth daily.      . citalopram (CELEXA) 20 MG tablet Take 20 mg by mouth daily.      . clorazepate (TRANXENE) 3.75 MG tablet Take 7.5 mg by mouth 2 (two) times daily.      . fish oil-omega-3 fatty acids 1000 MG capsule Take 1 g by mouth daily.      Marland Kitchen gabapentin (NEURONTIN) 100 MG capsule Take 200-300 mg by mouth 3 (three) times daily. Take 200mg  in AM and PM and 300mg  at Bedtime      .  lamoTRIgine (LAMICTAL) 200 MG tablet Take 200 mg by mouth daily.      . Multiple Vitamin (MULTIVITAMIN WITH MINERALS) TABS Take 1 tablet by mouth daily.      Marland Kitchen oxyCODONE-acetaminophen (PERCOCET/ROXICET) 5-325 MG per tablet Take 1 tablet by mouth every 4 (four) hours as needed. For pain      . pregabalin (LYRICA) 75 MG capsule Take 75 mg by mouth 2 (two) times daily.      . QUEtiapine (SEROQUEL) 300 MG tablet Take 300 mg by mouth at bedtime.      . simvastatin (ZOCOR) 40 MG tablet Take 40 mg by mouth every evening.      . venlafaxine (EFFEXOR) 75 MG tablet Take 75 mg by mouth 3 (three) times daily.      Marland Kitchen VITAMIN E PO Take 1 capsule by mouth daily.        No results found for this or any previous visit (from the past 48 hour(s)). No results found.  Review of Systems  Constitutional: Negative.   HENT: Negative.   Eyes: Negative.   Respiratory: Negative.   Cardiovascular: Negative.   Genitourinary: Negative.   Musculoskeletal: Negative.   Skin:  Negative.   Neurological: Negative.   Endo/Heme/Allergies: Negative.   Psychiatric/Behavioral: Negative.     Blood pressure 109/71, pulse 57, temperature 98.2 F (36.8 C), temperature source Oral, resp. rate 18, SpO2 98.00%. Physical Exam  Constitutional: She is oriented to person, place, and time. She appears well-developed and well-nourished. No distress.  HENT:  Head: Normocephalic and atraumatic.  Right Ear: External ear normal.  Left Ear: External ear normal.  Nose: Nose normal.  Mouth/Throat: Oropharynx is clear and moist.  Eyes: Conjunctivae normal and EOM are normal. Pupils are equal, round, and reactive to light. Right eye exhibits no discharge. Left eye exhibits no discharge.  Neck: Normal range of motion. Neck supple. No tracheal deviation present. No thyromegaly present.  Cardiovascular: Normal rate, regular rhythm, normal heart sounds and intact distal pulses.  Exam reveals no friction rub.   No murmur heard. Respiratory:  Effort normal and breath sounds normal. No respiratory distress. She has no wheezes.  GI: Soft. Bowel sounds are normal. She exhibits no distension. There is no tenderness.  Musculoskeletal: Normal range of motion. She exhibits no edema and no tenderness.  Neurological: She is alert and oriented to person, place, and time. She has normal reflexes. She displays normal reflexes. No cranial nerve deficit. She exhibits normal muscle tone. Coordination normal.  Skin: Skin is warm and dry. No rash noted. She is not diaphoretic. No erythema. No pallor.  Psychiatric: She has a normal mood and affect. Her behavior is normal. Judgment and thought content normal.     Assessment/Plan Lumbar pseudomeningocele with radicular symptoms. Plan reexploration of lumbar wound with repair of CSF fistula. Risks and benefits been explained. Patient wished to proceed.  Codie Hainer A 10/19/2012, 3:28 PM

## 2012-10-19 NOTE — Brief Op Note (Signed)
10/19/2012  5:28 PM  PATIENT:  Patricia Miles  57 y.o. female  PRE-OPERATIVE DIAGNOSIS:  pseudomeningocele  POST-OPERATIVE DIAGNOSIS:  pseudomeningocele  PROCEDURE:  Procedure(s) (LRB) with comments: LUMBAR WOUND DEBRIDEMENT (N/A) - Reexploration of wound,repair of Cerebral spinal fluid fistule  SURGEON:  Surgeon(s) and Role:    * Temple Pacini, MD - Primary    * Mariam Dollar, MD - Assisting  PHYSICIAN ASSISTANT:   ASSISTANTS:    ANESTHESIA:   general  EBL:     BLOOD ADMINISTERED:none  DRAINS: none   LOCAL MEDICATIONS USED:  NONE  SPECIMEN:  No Specimen  DISPOSITION OF SPECIMEN:  N/A  COUNTS:  YES  TOURNIQUET:  * No tourniquets in log *  DICTATION: .Dragon Dictation  PLAN OF CARE: Admit to inpatient   PATIENT DISPOSITION:  PACU - hemodynamically stable.   Delay start of Pharmacological VTE agent (>24hrs) due to surgical blood loss or risk of bleeding: yes

## 2012-10-19 NOTE — Op Note (Signed)
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  Microdissection  Surgeon:Mariyanna Mucha A.Rena Hunke, M.D.  Asst. Surgeon: Wynetta Emery  Anesthesia: General  Indication: 57 year old female 3 months status post L3-L4 decompression and fusion. Postoperatively the patient had significant improvement of her back and lower extremity pain but over the past month she has developed increasing back pain with some intermittent radicular symptoms particularly with sitting backward onto a hard surface. Patient has evidence of a enlarging postoperative pseudomeningocele. There was no known dural laceration the time of her surgery but this is consistent with a CSF fluid collection which is intermittently becoming compressed and causing her radicular symptoms and increase back pain. I discussed situation with the patient. She sided undergo reexploration of her wound with attempts at reclosure of a probable through her spinal fluid fistula.  Operative note: After induction anesthesia, patient positioned prone onto Wilson frame and appropriately padded. Lumbar region prepped and draped. Incision made and carried down sharply into the pseudomeningocele cavity. CSF was encountered under pressure and allowed to drain. Retractor was placed. Spinal canal was inspected. Around the axilla of the L3 nerve root on the left side there is an area were nerve roots were herniated through a small dural defect. CSF was obviously coming through this hole. There were no other areas of fistula. Microscope was brought field these were microdissection. The nerve root was reduced back into the spinal sac. The common dural tube was then reapproximated using 6-0 Prolene sutures in a  figure-of-eight fashion. Good primary closure of the CSF fistula was achieved. There was no evidence of any continued CSF leakage with intraoperative assault maneuver. The remainder of the spinal canal was inspected and found to be free of any other problems. Wound is irrigated and bike solution. DuraSeal fibrin sealant was placed over the dural repair as was Gelfoam placed over the laminectomy defect. Wound was reapproximated with Vicryl sutures in layers. Steri-Strips sterile dressing were applied. There were no apparent complications. Given the good primary closure of the wound placement of lumbar drain was not felt to be necessary.

## 2012-10-19 NOTE — Anesthesia Postprocedure Evaluation (Signed)
  Anesthesia Post-op Note  Patient: Patricia Miles  Procedure(s) Performed: Procedure(s) (LRB) with comments: LUMBAR WOUND DEBRIDEMENT (N/A) - Reexploration of wound,repair of Cerebral spinal fluid fistule  Patient Location: PACU  Anesthesia Type:General  Level of Consciousness: awake and alert   Airway and Oxygen Therapy: Patient Spontanous Breathing and Patient connected to nasal cannula oxygen  Post-op Pain: mild  Post-op Assessment: Post-op Vital signs reviewed  Post-op Vital Signs: Reviewed  Complications: No apparent anesthesia complications

## 2012-10-19 NOTE — Transfer of Care (Signed)
Immediate Anesthesia Transfer of Care Note  Patient: Patricia Miles  Procedure(s) Performed: Procedure(s) (LRB) with comments: LUMBAR WOUND DEBRIDEMENT (N/A) - Reexploration of wound,repair of Cerebral spinal fluid fistule  Patient Location: PACU  Anesthesia Type:General  Level of Consciousness: awake  Airway & Oxygen Therapy: Patient Spontanous Breathing and Patient connected to nasal cannula oxygen  Post-op Assessment: Report given to PACU RN, Post -op Vital signs reviewed and stable and Patient moving all extremities  Post vital signs: Reviewed and stable  Complications: No apparent anesthesia complications

## 2012-10-20 ENCOUNTER — Encounter (HOSPITAL_COMMUNITY): Payer: Self-pay

## 2012-10-20 MED ORDER — INFLUENZA VIRUS VACC SPLIT PF IM SUSP
0.5000 mL | INTRAMUSCULAR | Status: AC
  Administered 2012-10-21: 0.5 mL via INTRAMUSCULAR
  Filled 2012-10-20: qty 0.5

## 2012-10-20 NOTE — Progress Notes (Signed)
Postop day 1. Overall feels better. No leg pain. Back pain well controlled. Wound clean and dry. Neurologically intact. No headache.  Continue bedrest and observation. Tentatively plan for discharge Thursday morning.

## 2012-10-20 NOTE — Progress Notes (Signed)
Verified Bedrest order from MD, He stated pt still on bedrest but can use the [facility], will continue to monitor.

## 2012-10-21 ENCOUNTER — Encounter (HOSPITAL_COMMUNITY): Payer: Self-pay | Admitting: Neurosurgery

## 2012-10-21 MED ORDER — OXYCODONE-ACETAMINOPHEN 5-325 MG PO TABS: 1.0000 | ORAL_TABLET | ORAL | Status: AC | PRN

## 2012-10-21 NOTE — Care Management Note (Signed)
    Page 1 of 1   10/21/2012     1:27:20 PM   CARE MANAGEMENT NOTE 10/21/2012  Patient:  Patricia Miles, Patricia Miles   Account Number:  [id number]  Date Initiated:  10/20/2012  Documentation initiated by:  [facility]  Subjective/Objective Assessment:   admitted postop reexploration of lumdar wound, closure of CSF fistula     Action/Plan:   Anticipated DC Date:  10/23/2012   Anticipated DC Plan:  HOME/SELF CARE      DC Planning Services  CM consult      Choice offered to / List presented to:             Status of service:  Completed, signed off Medicare Important Message given?   (If response is "NO", the following Medicare IM given date fields will be blank) Date Medicare IM given:   Date Additional Medicare IM given:    Discharge Disposition:  HOME/SELF CARE  Per UR Regulation:  Reviewed for med. necessity/level of care/duration of stay  If discussed at Long Length of Stay Meetings, dates discussed:    Comments:

## 2012-10-21 NOTE — Discharge Summary (Signed)
Physician Discharge Summary  Patient ID: Patricia Miles MRN: [id number] DOB/AGE: 1956/03/01 57 y.o.  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 Postoperative she is done very well. She has no headache. Her preoperative back and lower extremity pain are much improved. Strength sensation are intact. Patient ready for discharge home understanding that she will maintain a very light activity level and continue with significant periods of bed rest.  Consults:   Significant Diagnostic Studies:   Treatments:   Discharge Exam: Blood pressure 80/50, pulse 64, temperature 97.7 F (36.5 C), temperature source Oral, resp. rate 17, height 5\' 5"  (1.651 m), weight 66.769 kg (147 lb 3.2 oz), SpO2 96.00%. Awake and alert oriented and appropriate. Cranial nerve function is intact. Motor and sensory function extremities normal. Wound clean dry and intact. Chest and abdomen benign.  Disposition: 01-Home or Self Care     Medication List     As of 10/21/2012  8:45 AM    TAKE these medications         atenolol 25 MG tablet   Commonly known as: TENORMIN   Take 25 mg by mouth daily.      busPIRone 15 MG tablet   Commonly known as: BUSPAR   Take 15 mg by mouth 3 (three) times daily.      CALCIUM PO   Take 1 tablet by mouth daily.      citalopram 20 MG tablet   Commonly known as: CELEXA   Take 20 mg by mouth daily.      clorazepate 3.75 MG tablet   Commonly known as: TRANXENE   Take 7.5 mg by mouth 2 (two) times daily.      fish oil-omega-3 fatty acids 1000 MG capsule   Take 1 g by mouth daily.      gabapentin 100 MG capsule   Commonly known as: NEURONTIN   Take 200-300 mg by mouth 3 (three)  times daily. Take 200mg  in AM and PM and 300mg  at Bedtime      lamoTRIgine 200 MG tablet   Commonly known as: LAMICTAL   Take 200 mg by mouth daily.      multivitamin with minerals Tabs   Take 1 tablet by mouth daily.      oxyCODONE-acetaminophen 5-325 MG per tablet   Commonly known as: PERCOCET/ROXICET   Take 1-2 tablets by mouth every 4 (four) hours as needed.      oxyCODONE-acetaminophen 5-325 MG per tablet   Commonly known as: PERCOCET/ROXICET   Take 1 tablet by mouth every 4 (four) hours as needed. For pain      pregabalin 75 MG capsule   Commonly known as: LYRICA   Take 75 mg by mouth 2 (two) times daily.      QUEtiapine 300 MG tablet   Commonly known as: SEROQUEL   Take 300 mg by mouth at bedtime.      simvastatin 40 MG tablet   Commonly known as: ZOCOR   Take 40 mg by mouth every evening.      venlafaxine 75 MG tablet   Commonly known as: EFFEXOR   Take 75 mg by mouth 3 (three) times daily.      VITAMIN E PO   Take 1 capsule by mouth daily.  Follow-up Information    Follow up with Mourad Cwikla A, MD. Call in 1 week. (Ask for Lurena Joiner)    Contact information:   [address] [place] [address] [phone]          Signed: Bronsyn Shappell A 10/21/2012, 8:45 AM

## 2014-10-27 IMAGING — RF DG C-ARM 61-120 MIN
1 series · 2 of 2 positions shown · non-contrast
Comparison: 04/13/2012 MRI

CLINICAL DATA: Back pain

DG C-ARM 61-120 MIN,LUMBAR SPINE - 2-3 VIEW

[Series 1: run · 2 of 2 slices shown]
[im 1/2]
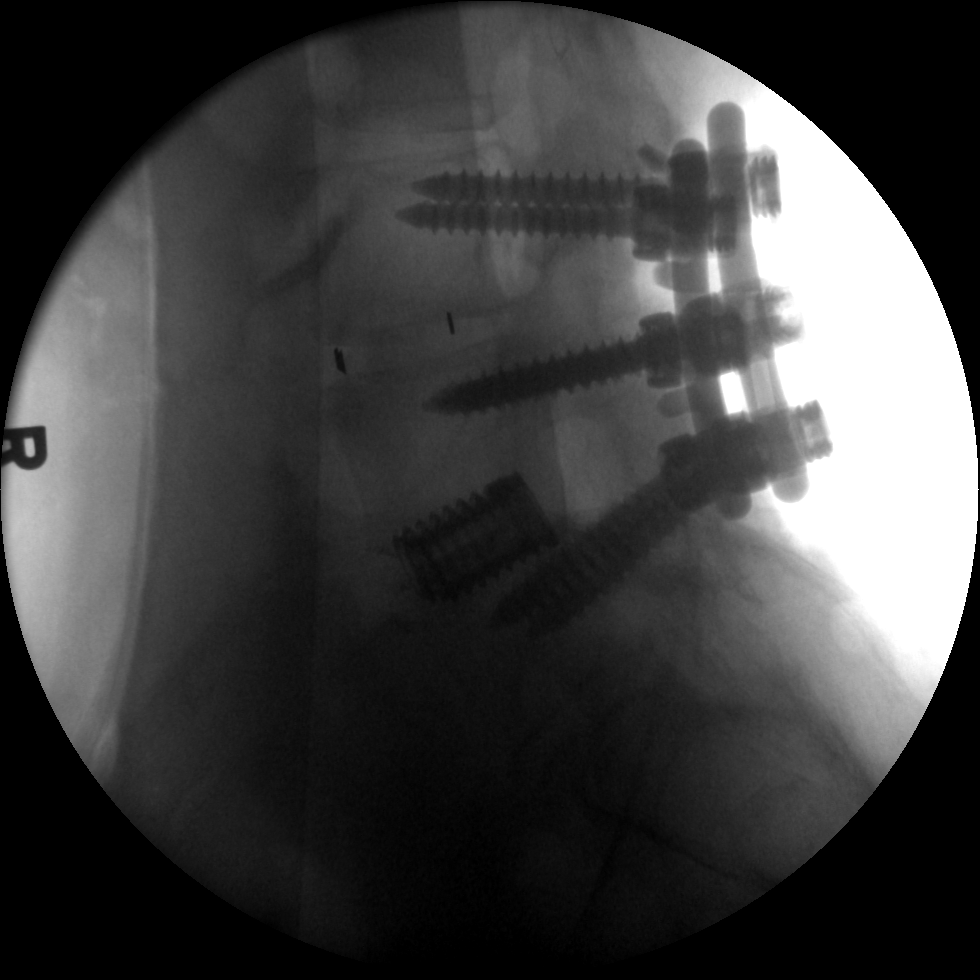
[im 2/2]
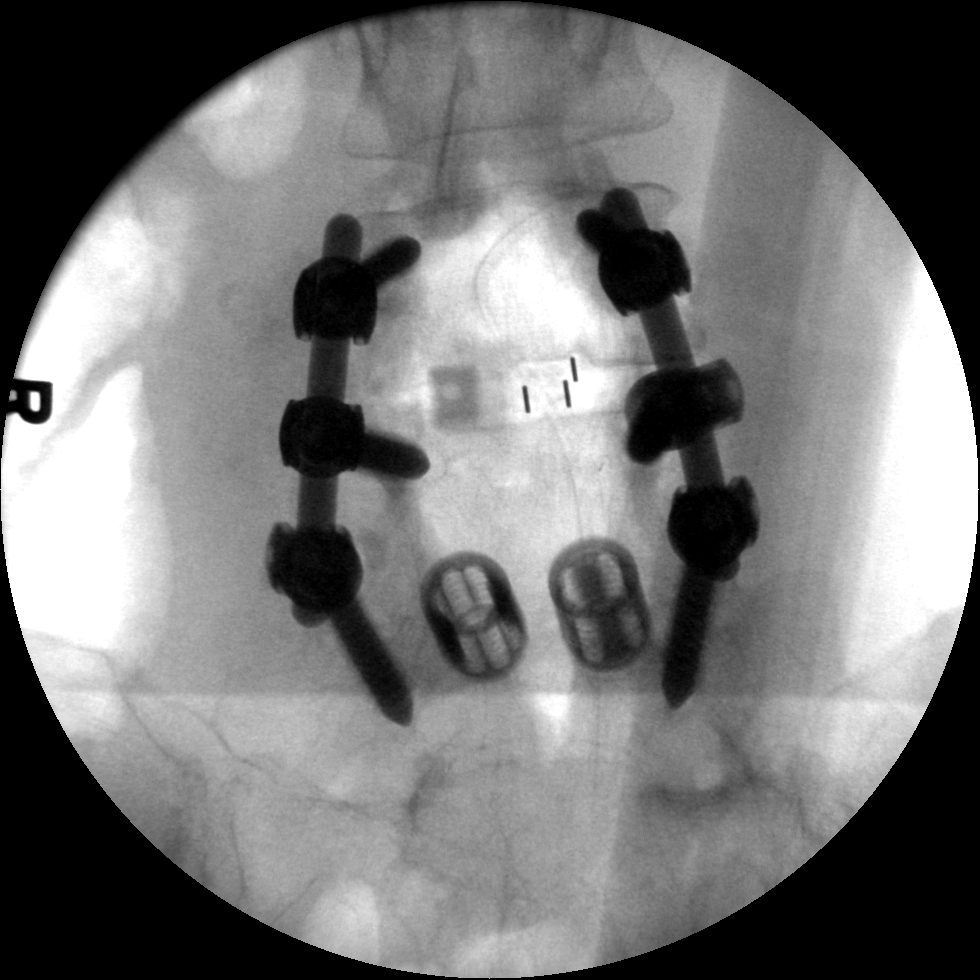

[2 of 2 positions shown; findings below may reference images not displayed]

FINDINGS: The prior fusion at L4-5 has been extended upward to
involve L3-L4 with interbody cage and pedicle screw fixation.
IMPRESSION: As above.

## 2017-08-01 SURGERY — Surgical Case
Anesthesia: *Unknown
